# Patient Record
Sex: Female | Born: 1980 | Race: Black or African American | Hispanic: No | Smoking: Current every day smoker
Health system: Southern US, Community
[De-identification: ages and names within clinical notes are randomized; demographics above are authoritative.]

## PROBLEM LIST (undated history)

## (undated) DIAGNOSIS — J45909 Unspecified asthma, uncomplicated: Secondary | ICD-10-CM

## (undated) HISTORY — PX: TUBAL LIGATION: SHX77

## (undated) HISTORY — PX: CHOLECYSTECTOMY: SHX55

---

## 2014-03-05 ENCOUNTER — Encounter (HOSPITAL_COMMUNITY): Payer: Self-pay | Admitting: Emergency Medicine

## 2014-03-05 ENCOUNTER — Emergency Department (HOSPITAL_COMMUNITY)
Admission: EM | Admit: 2014-03-05 | Discharge: 2014-03-06 | Disposition: A | Discharge status: Home / Self Care | Payer: Medicaid Other | Attending: Emergency Medicine | Admitting: Emergency Medicine

## 2014-03-05 DIAGNOSIS — R42 Dizziness and giddiness: Secondary | ICD-10-CM | POA: Insufficient documentation

## 2014-03-05 DIAGNOSIS — F17200 Nicotine dependence, unspecified, uncomplicated: Secondary | ICD-10-CM | POA: Insufficient documentation

## 2014-03-05 DIAGNOSIS — R0789 Other chest pain: Secondary | ICD-10-CM

## 2014-03-05 DIAGNOSIS — J45901 Unspecified asthma with (acute) exacerbation: Secondary | ICD-10-CM | POA: Insufficient documentation

## 2014-03-05 DIAGNOSIS — R071 Chest pain on breathing: Secondary | ICD-10-CM | POA: Insufficient documentation

## 2014-03-05 HISTORY — DX: Unspecified asthma, uncomplicated: J45.909

## 2014-03-05 MED ORDER — OXYCODONE-ACETAMINOPHEN 5-325 MG PO TABS
2.0000 | ORAL_TABLET | Freq: Once | ORAL | Status: AC
  Administered 2014-03-06: 2 via ORAL
  Filled 2014-03-05: qty 2

## 2014-03-05 MED ORDER — ONDANSETRON 8 MG PO TBDP
8.0000 mg | ORAL_TABLET | Freq: Once | ORAL | Status: AC
  Administered 2014-03-06: 8 mg via ORAL
  Filled 2014-03-05: qty 1

## 2014-03-05 MED ORDER — NAPROXEN 500 MG PO TABS
500.0000 mg | ORAL_TABLET | Freq: Once | ORAL | Status: AC
  Administered 2014-03-06: 500 mg via ORAL
  Filled 2014-03-05: qty 1

## 2014-03-05 NOTE — ED Notes (Signed)
Pt states chest pain started earlier today and sharp stabbing,  With sob,

## 2014-03-06 ENCOUNTER — Emergency Department (HOSPITAL_COMMUNITY): Payer: Medicaid Other

## 2014-03-06 LAB — CBC WITH DIFFERENTIAL/PLATELET
BASOS PCT: 0 % (ref 0–1)
Basophils Absolute: 0 10*3/uL (ref 0.0–0.1)
Eosinophils Absolute: 0.2 10*3/uL (ref 0.0–0.7)
Eosinophils Relative: 3 % (ref 0–5)
HEMATOCRIT: 31.8 % — AB (ref 36.0–46.0)
Hemoglobin: 10.5 g/dL — ABNORMAL LOW (ref 12.0–15.0)
Lymphocytes Relative: 47 % — ABNORMAL HIGH (ref 12–46)
Lymphs Abs: 3.7 10*3/uL (ref 0.7–4.0)
MCH: 26.8 pg (ref 26.0–34.0)
MCHC: 33 g/dL (ref 30.0–36.0)
MCV: 81.1 fL (ref 78.0–100.0)
MONO ABS: 0.4 10*3/uL (ref 0.1–1.0)
MONOS PCT: 5 % (ref 3–12)
Neutro Abs: 3.6 10*3/uL (ref 1.7–7.7)
Neutrophils Relative %: 45 % (ref 43–77)
Platelets: 239 10*3/uL (ref 150–400)
RBC: 3.92 MIL/uL (ref 3.87–5.11)
RDW: 14.9 % (ref 11.5–15.5)
WBC: 7.9 10*3/uL (ref 4.0–10.5)

## 2014-03-06 LAB — BASIC METABOLIC PANEL
BUN: 8 mg/dL (ref 6–23)
CO2: 23 meq/L (ref 19–32)
CREATININE: 0.68 mg/dL (ref 0.50–1.10)
Calcium: 9.2 mg/dL (ref 8.4–10.5)
Chloride: 104 mEq/L (ref 96–112)
GFR calc non Af Amer: >90 mL/min (ref >90)
Glucose, Bld: 99 mg/dL (ref 70–99)
Potassium: 3.3 mEq/L — ABNORMAL LOW (ref 3.7–5.3)
Sodium: 140 mEq/L (ref 137–147)

## 2014-03-06 LAB — TROPONIN I: Troponin I: <0.3 ng/mL (ref <0.30)

## 2014-03-06 MED ORDER — ONDANSETRON 8 MG PO TBDP: 8.0000 mg | ORAL_TABLET | Freq: Three times a day (TID) | ORAL | Status: DC | PRN

## 2014-03-06 MED ORDER — NAPROXEN 500 MG PO TABS: 500.0000 mg | ORAL_TABLET | Freq: Two times a day (BID) | ORAL | Status: DC

## 2014-03-06 MED ORDER — OXYCODONE-ACETAMINOPHEN 5-325 MG PO TABS: 1.0000 | ORAL_TABLET | Freq: Four times a day (QID) | ORAL | Status: DC | PRN

## 2014-03-06 NOTE — Discharge Instructions (Signed)
Costochondritis Costochondritis, sometimes called Tietze syndrome, is a swelling and irritation (inflammation) of the tissue (cartilage) that connects your ribs with your breastbone (sternum). It causes pain in the chest and rib area. Costochondritis usually goes away on its own over time. It can take up to 6 weeks or longer to get better, especially if you are unable to limit your activities. CAUSES  Some cases of costochondritis have no known cause. Possible causes include:  Injury (trauma).  Exercise or activity such as lifting.  Severe coughing. SIGNS AND SYMPTOMS  Pain and tenderness in the chest and rib area.  Pain that gets worse when coughing or taking deep breaths.  Pain that gets worse with specific movements. DIAGNOSIS  Your health care provider will do a physical exam and ask about your symptoms. Chest X-rays or other tests may be done to rule out other problems. TREATMENT  Costochondritis usually goes away on its own over time. Your health care provider may prescribe medicine to help relieve pain. HOME CARE INSTRUCTIONS   Avoid exhausting physical activity. Try not to strain your ribs during normal activity. This would include any activities using chest, abdominal, and side muscles, especially if heavy weights are used.  Apply ice to the affected area for the first 2 days after the pain begins.  Put ice in a plastic bag.  Place a towel between your skin and the bag.  Leave the ice on for 20 minutes, 2 3 times a day.  Only take over-the-counter or prescription medicines as directed by your health care provider. SEEK MEDICAL CARE IF:  You have redness or swelling at the rib joints. These are signs of infection.  Your pain does not go away despite rest or medicine. SEEK IMMEDIATE MEDICAL CARE IF:   Your pain increases or you are very uncomfortable.  You have shortness of breath or difficulty breathing.  You cough up blood.  You have worse chest pains,  sweating, or vomiting.  You have a fever or persistent symptoms for more than 2 3 days.  You have a fever and your symptoms suddenly get worse. MAKE SURE YOU:   Understand these instructions.  Will watch your condition.  Will get help right away if you are not doing well or get worse. Document Released: 06/26/2005 Document Revised: 07/07/2013 Document Reviewed: 04/20/2013 [facility] Patient Information 2014 [place], [place].  Chest Wall Pain Chest wall pain is pain in or around the bones and muscles of your chest. It may take up to 6 weeks to get better. It may take longer if you must stay physically active in your work and activities.  CAUSES  Chest wall pain may happen on its own. However, it may be caused by:  A viral illness like the flu.  Injury.  Coughing.  Exercise.  Arthritis.  Fibromyalgia.  Shingles. HOME CARE INSTRUCTIONS   Avoid overtiring physical activity. Try not to strain or perform activities that cause pain. This includes any activities using your chest or your abdominal and side muscles, especially if heavy weights are used.  Put ice on the sore area.  Put ice in a plastic bag.  Place a towel between your skin and the bag.  Leave the ice on for 15-20 minutes per hour while awake for the first 2 days.  Only take over-the-counter or prescription medicines for pain, discomfort, or fever as directed by your caregiver. SEEK IMMEDIATE MEDICAL CARE IF:   Your pain increases, or you are very uncomfortable.  You have a fever.  Your chest pain becomes worse.  You have new, unexplained symptoms.  You have nausea or vomiting.  You feel sweaty or lightheaded.  You have a cough with phlegm (sputum), or you cough up blood. MAKE SURE YOU:   Understand these instructions.  Will watch your condition.  Will get help right away if you are not doing well or get worse. Document Released: 09/16/2005 Document Revised: 12/09/2011 Document Reviewed:  05/13/2011 [facility] Patient Information 2014 [place].

## 2014-03-06 NOTE — ED Provider Notes (Addendum)
CSN: [id number]     Arrival date & time 03/05/14  2249 History   First MD Initiated Contact with Patient 03/05/14 2330     Chief Complaint  Patient presents with  . Chest Pain  . Shortness of Breath     (Consider location/radiation/quality/duration/timing/severity/associated sxs/prior Treatment) HPI 33 year old female presents to emergency apartment with complaint of central chest pain started today around 3 PM.  Pain has been sharp stabbing.  Pain comes in waves, cramping or spasming in nature.  Patient reports some shortness of breath due to pain with deep inhalation.  She reports she's been dizzy.  She denies any palpitations or racing heart rate, no nausea, no diaphoresis.  No fever chills nausea vomiting or diarrhea.  No cough.  Patient reports similar presentation about 2 months ago, was seen at outside hospital.  They told her she had muscle spasms and was placed on Percocet which she took for 2 weeks.  After this time the pain resolved.  She did not followup with her primary care Dr.  Patient reports that her grandfather recently died of coronary disease complications at the age of 33.  She reports her mother had DVT in her 4740s to 5950s.  Patient denies any prolonged immobilization, no exogenous hormones, no calf pain or swelling.  Patient reports she smokes 5 cigarettes a week.  She denies any cocaine use. Past Medical History  Diagnosis Date  . Asthma    Past Surgical History  Procedure Laterality Date  . Cholecystectomy     History reviewed. No pertinent family history. History  Substance Use Topics  . Smoking status: Not on file  . Smokeless tobacco: Not on file  . Alcohol Use: Yes     Comment: occassionally    Review of Systems   See History of Present Illness; otherwise all other systems are reviewed and negative  Allergies  Dilaudid  Home Medications   Prior to Admission medications   Not on File   BP 123/71  Pulse 92  Temp(Src) 98.4 F (36.9 C) (Oral)  Resp  18  SpO2 97% Physical Exam  Nursing note and vitals reviewed. Constitutional: He is oriented to person, place, and time. He appears well-developed and well-nourished. He appears distressed.  HENT:  Head: Normocephalic and atraumatic.  Right Ear: External ear normal.  Left Ear: External ear normal.  Nose: Nose normal.  Mouth/Throat: Oropharynx is clear and moist.  Eyes: Conjunctivae and EOM are normal. Pupils are equal, round, and reactive to light.  Neck: Normal range of motion. Neck supple. No JVD present. No tracheal deviation present. No thyromegaly present.  Cardiovascular: Normal rate, regular rhythm, normal heart sounds and intact distal pulses.  Exam reveals no gallop and no friction rub.   No murmur heard. Pulmonary/Chest: Effort normal and breath sounds normal. No stridor. No respiratory distress. He has no wheezes. He has no rales. He exhibits tenderness (patient has tenderness to left side of the sternum at costal margin.  Palpation of this region reproduces her pain that brought her to the emergency department today).  Abdominal: Soft. Bowel sounds are normal. He exhibits no distension and no mass. There is no tenderness. There is no rebound and no guarding.  Musculoskeletal: Normal range of motion. He exhibits no edema and no tenderness.  Lymphadenopathy:    He has no cervical adenopathy.  Neurological: He is alert and oriented to person, place, and time. He exhibits normal muscle tone. Coordination normal.  Skin: Skin is warm and dry. No rash  noted. No erythema. No pallor.  Psychiatric: He has a normal mood and affect. His behavior is normal. Judgment and thought content normal.    ED Course  Procedures (including critical care time) Labs Review Labs Reviewed  CBC WITH DIFFERENTIAL - Abnormal; Notable for the following:    Hemoglobin 10.5 (*)    HCT 31.8 (*)    Lymphocytes Relative 47 (*)    All other components within normal limits  BASIC METABOLIC PANEL - Abnormal;  Notable for the following:    Potassium 3.3 (*)    All other components within normal limits  TROPONIN I    Imaging Review Dg Chest 2 View  03/06/2014   CLINICAL DATA:  Chest pain and shortness of Breath.  EXAM: CHEST  2 VIEW  COMPARISON:  None.  FINDINGS: The cardiac silhouette, mediastinal and hilar contours are normal. The lungs are clear. No pleural effusion. The bony thorax is intact.  IMPRESSION: No acute cardiopulmonary findings.   Electronically Signed   By: Loralie Champagne M.D.   On: 03/06/2014 00:16     EKG Interpretation   Date/Time:  Saturday March 05 2014 22:58:00 EDT Ventricular Rate:  91 PR Interval:  155 QRS Duration: 91 QT Interval:  360 QTC Calculation: 443 R Axis:   26 Text Interpretation:  Sinus rhythm Borderline T wave abnormalities No old  tracing to compare Confirmed by Adylee Leonardo  MD, Dayna Alia ([id number]) on 03/05/2014  11:35:25 PM      MDM   Final diagnoses:  Costochondral chest pain    33 year old female with central chest pain that is reproducible on exam.  Patient's pertinent negative.  She is low risk for coronary disease.  Her EKG shows some nonspecific T wave abnormalities.  Pain has been persistent since 3 PM.  Will treat for pain, check baseline labs to rule out other significant causes of pain, but I feel this is costochondritis.  1:16 AM Pt feeling better.  Workup here unremarkable.  To f/u with pcm  Olivia Mackie, MD 03/06/14 6213  Olivia Mackie, MD 03/06/14 [phone]

## 2014-03-15 ENCOUNTER — Encounter (HOSPITAL_BASED_OUTPATIENT_CLINIC_OR_DEPARTMENT_OTHER): Payer: Self-pay | Admitting: Emergency Medicine

## 2014-03-15 ENCOUNTER — Emergency Department (HOSPITAL_BASED_OUTPATIENT_CLINIC_OR_DEPARTMENT_OTHER)
Admission: EM | Admit: 2014-03-15 | Discharge: 2014-03-15 | Disposition: A | Discharge status: Home / Self Care | Payer: Medicaid Other | Attending: Emergency Medicine | Admitting: Emergency Medicine

## 2014-03-15 ENCOUNTER — Emergency Department (HOSPITAL_BASED_OUTPATIENT_CLINIC_OR_DEPARTMENT_OTHER): Payer: Medicaid Other

## 2014-03-15 DIAGNOSIS — E669 Obesity, unspecified: Secondary | ICD-10-CM | POA: Insufficient documentation

## 2014-03-15 DIAGNOSIS — R609 Edema, unspecified: Secondary | ICD-10-CM | POA: Insufficient documentation

## 2014-03-15 DIAGNOSIS — R079 Chest pain, unspecified: Secondary | ICD-10-CM

## 2014-03-15 DIAGNOSIS — R072 Precordial pain: Secondary | ICD-10-CM | POA: Insufficient documentation

## 2014-03-15 DIAGNOSIS — Z791 Long term (current) use of non-steroidal anti-inflammatories (NSAID): Secondary | ICD-10-CM | POA: Insufficient documentation

## 2014-03-15 DIAGNOSIS — R42 Dizziness and giddiness: Secondary | ICD-10-CM | POA: Insufficient documentation

## 2014-03-15 DIAGNOSIS — F17200 Nicotine dependence, unspecified, uncomplicated: Secondary | ICD-10-CM | POA: Insufficient documentation

## 2014-03-15 DIAGNOSIS — J45901 Unspecified asthma with (acute) exacerbation: Secondary | ICD-10-CM | POA: Insufficient documentation

## 2014-03-15 LAB — BASIC METABOLIC PANEL
BUN: 8 mg/dL (ref 6–23)
CHLORIDE: 102 meq/L (ref 96–112)
CO2: 25 meq/L (ref 19–32)
Calcium: 9.8 mg/dL (ref 8.4–10.5)
Creatinine, Ser: 0.8 mg/dL (ref 0.50–1.10)
GFR calc non Af Amer: >90 mL/min (ref >90)
Glucose, Bld: 98 mg/dL (ref 70–99)
POTASSIUM: 3.9 meq/L (ref 3.7–5.3)
Sodium: 141 mEq/L (ref 137–147)

## 2014-03-15 LAB — CBC WITH DIFFERENTIAL/PLATELET
BASOS PCT: 0 % (ref 0–1)
Basophils Absolute: 0 10*3/uL (ref 0.0–0.1)
Eosinophils Absolute: 0.2 10*3/uL (ref 0.0–0.7)
Eosinophils Relative: 3 % (ref 0–5)
HEMATOCRIT: 33.9 % — AB (ref 36.0–46.0)
HEMOGLOBIN: 11.1 g/dL — AB (ref 12.0–15.0)
Lymphocytes Relative: 38 % (ref 12–46)
Lymphs Abs: 3.1 10*3/uL (ref 0.7–4.0)
MCH: 27.2 pg (ref 26.0–34.0)
MCHC: 32.7 g/dL (ref 30.0–36.0)
MCV: 83.1 fL (ref 78.0–100.0)
Monocytes Absolute: 0.5 10*3/uL (ref 0.1–1.0)
Monocytes Relative: 6 % (ref 3–12)
NEUTROS ABS: 4.3 10*3/uL (ref 1.7–7.7)
NEUTROS PCT: 53 % (ref 43–77)
Platelets: 263 10*3/uL (ref 150–400)
RBC: 4.08 MIL/uL (ref 3.87–5.11)
RDW: 14.8 % (ref 11.5–15.5)
WBC: 8 10*3/uL (ref 4.0–10.5)

## 2014-03-15 LAB — TROPONIN I: Troponin I: <0.3 ng/mL (ref <0.30)

## 2014-03-15 LAB — D-DIMER, QUANTITATIVE (NOT AT ARMC)

## 2014-03-15 MED ORDER — KETOROLAC TROMETHAMINE 60 MG/2ML IM SOLN
60.0000 mg | Freq: Once | INTRAMUSCULAR | Status: AC
  Administered 2014-03-15: 60 mg via INTRAMUSCULAR
  Filled 2014-03-15: qty 2

## 2014-03-15 MED ORDER — IBUPROFEN 600 MG PO TABS: 600.0000 mg | ORAL_TABLET | Freq: Four times a day (QID) | ORAL | Status: DC | PRN

## 2014-03-15 MED ORDER — HYDROCODONE-ACETAMINOPHEN 5-325 MG PO TABS: 1.0000 | ORAL_TABLET | Freq: Four times a day (QID) | ORAL | Status: DC | PRN

## 2014-03-15 NOTE — ED Provider Notes (Addendum)
CSN: [id number]     Arrival date & time 03/15/14  2110 History  This chart was scribed for Shelby Batonourtney F Horton, MD by Ardelia Memsylan Malpass, ED Scribe. This patient was seen in room MH10/MH10 and the patient's care was started at 9:23 PM.   Chief Complaint  Patient presents with  . Chest Pain    The history is provided by the patient. No language interpreter was used.    HPI Comments: Shelby Leach is a 33 y.o. female who presents to the Emergency Department complaining of intermittent, "sharp, shooting" substernal chest pain over the past 1-2 weeks. She states that her pain is worsened with deep breathing and movement. She rates her pain at 8/10 currently, and states that her pain is a 9 or 10/10 at its worst. She reports associated SOB while she is having chest pain. She also reports associated lightheadedness today. She states that she had associated episodes of emesis when her chest pain began 1-2 weeks ago, but none since. She states that she had mild swelling in her right ankle last week, which has resolved. She states [facility] the same chest pain. She states that she was told her pain was due to inflammation, and she is concerned that there could instead be a cardiac cause. She denies any history of HTN, DM or HLD. She denies any history of DVT/PE. She denies any history of cardiac disease, but notes that her grandfather had an MI in his 6750s. She denies any associated cough or fever. She states that she is not a smoker. She reports a history of tubal ligation, and states there is no chance she could be pregnant.   Past Medical History  Diagnosis Date  . Asthma    Past Surgical History  Procedure Laterality Date  . Cholecystectomy    . Tubal ligation     No family history on file. History  Substance Use Topics  . Smoking status: Current Every Day Smoker  . Smokeless tobacco: Not on file  . Alcohol Use: Yes     Comment: occassionally   OB  History   Grav Para Term Preterm Abortions TAB SAB Ect Mult Living                 Review of Systems  Constitutional: Negative for fever.  Respiratory: Positive for chest tightness and shortness of breath. Negative for cough.   Cardiovascular: Negative for chest pain and leg swelling.  Gastrointestinal: Negative for nausea, vomiting and abdominal pain.  Genitourinary: Negative for dysuria.  Musculoskeletal: Negative for back pain.  Skin: Negative for rash.  Neurological: Negative for headaches.  All other systems reviewed and are negative.    Allergies  Dilaudid  Home Medications   Prior to Admission medications   Medication Sig Start Date End Date Taking? Authorizing Provider  HYDROcodone-acetaminophen (NORCO/VICODIN) 5-325 MG per tablet Take 1 tablet by mouth every 6 (six) hours as needed for moderate pain or severe pain. 03/15/14   Shelby Batonourtney F Horton, MD  ibuprofen (ADVIL,MOTRIN) 600 MG tablet Take 1 tablet (600 mg total) by mouth every 6 (six) hours as needed. 03/15/14   Shelby Batonourtney F Horton, MD  naproxen (NAPROSYN) 500 MG tablet Take 1 tablet (500 mg total) by mouth 2 (two) times daily with a meal. 03/06/14   Olivia Mackielga M Otter, MD  ondansetron (ZOFRAN-ODT) 8 MG disintegrating tablet Take 1 tablet (8 mg total) by mouth every 8 (eight) hours as needed  for nausea or vomiting. 03/06/14   Olivia Mackie, MD  oxyCODONE-acetaminophen (PERCOCET/ROXICET) 5-325 MG per tablet Take 1-2 tablets by mouth every 6 (six) hours as needed for severe pain. 03/06/14   Olivia Mackie, MD   Triage Vitals: BP 135/83  Pulse 90  Temp(Src) 98.4 F (36.9 C) (Oral)  Resp 20  Ht 5\' 2"  (1.575 m)  Wt 267 lb (121.11 kg)  BMI 48.82 kg/m2  SpO2 97%  LMP 03/09/2014  Physical Exam  Nursing note and vitals reviewed. Constitutional: She is oriented to person, place, and time. She appears well-developed and well-nourished.  Obese  HENT:  Head: Normocephalic and atraumatic.  Cardiovascular: Normal rate, regular rhythm and  normal heart sounds.   No murmur heard. Pulmonary/Chest: Effort normal and breath sounds normal. No respiratory distress. She has no wheezes. She exhibits no tenderness.  Abdominal: Soft. Bowel sounds are normal. There is no tenderness. There is no rebound.  Musculoskeletal:  Trace symmetric bilateral lower remedy edema  Neurological: She is alert and oriented to person, place, and time.  Skin: Skin is warm and dry.  Psychiatric: She has a normal mood and affect.    ED Course  Procedures (including critical care time)  DIAGNOSTIC STUDIES: Oxygen Saturation is 97% on RA, normal by my interpretation.    COORDINATION OF CARE: 9:28 PM- Discussed plan to obtain a CXR, along with diagnostic lab work. Will also order Toradol. Pt advised of plan for treatment and pt agrees.  Labs Review Labs Reviewed  CBC WITH DIFFERENTIAL - Abnormal; Notable for the following:    Hemoglobin 11.1 (*)    HCT 33.9 (*)    All other components within normal limits  BASIC METABOLIC PANEL  D-DIMER, QUANTITATIVE  TROPONIN I    Imaging Review Dg Chest 2 View  03/15/2014   CLINICAL DATA:  Left chest pain and shortness of breath. History of asthma and smoking.  EXAM: CHEST  2 VIEW  COMPARISON:  Chest radiograph performed 02/09/2014  FINDINGS: The lungs are well-aerated. Pulmonary vascularity is at the upper limits of normal. There is no evidence of focal opacification, pleural effusion or pneumothorax.  The heart is normal in size; the mediastinal contour is within normal limits. No acute osseous abnormalities are seen. Clips are noted within the right upper quadrant, reflecting prior cholecystectomy.  IMPRESSION: No acute cardiopulmonary process seen.   Electronically Signed   By: Roanna Raider M.D.   On: 03/15/2014 22:10     EKG Interpretation  EKG: Normal sinus rhythm with a rate of 96, nonspecific T wave abnormalities which are unchanged from prior, no evidence of acute ischemia      MDM   Final  diagnoses:  Chest pain    Patient presents with chest pain. Was seen and evaluated one week ago for the same. At that time felt to have costochondritis. She reports persistent symptoms since that time. She is nontoxic on exam.  Heart rate during evaluation was 105. EKG is unchanged from prior. Chest x-ray shows no evidence of pneumonia or pneumothorax. D-dimer is negative. Troponin is reassuring. Discussed workup with patient. At this time unsure of etiology for chest pain but suspect continued inflammation. Have encouraged her to continue anti-inflammatories at home.  Patient to followup with PCP.  After history, exam, and medical workup I feel the patient has been appropriately medically screened and is safe for discharge home. Pertinent diagnoses were discussed with the patient. Patient was given return precautions.  I personally performed the services  described in this documentation, which was scribed in my presence. The recorded information has been reviewed and is accurate.   Shelby Batonourtney F Horton, MD 03/15/14 [id number]  Shelby Batonourtney F Horton, MD 03/15/14 [phone]

## 2014-03-15 NOTE — ED Notes (Signed)
MD at bedside. 

## 2014-03-15 NOTE — Discharge Instructions (Signed)
Chest Pain (Nonspecific) °It is often hard to give a specific diagnosis for the cause of chest pain. There is always a chance that your pain could be related to something serious, such as a heart attack or a blood clot in the lungs. You need to follow up with your caregiver for further evaluation. °CAUSES  °· Heartburn. °· Pneumonia or bronchitis. °· Anxiety or stress. °· Inflammation around your heart (pericarditis) or lung (pleuritis or pleurisy). °· A blood clot in the lung. °· A collapsed lung (pneumothorax). It can develop suddenly on its own (spontaneous pneumothorax) or from injury (trauma) to the chest. °· Shingles infection (herpes zoster virus). °The chest wall is composed of bones, muscles, and cartilage. Any of these can be the source of the pain. °· The bones can be bruised by injury. °· The muscles or cartilage can be strained by coughing or overwork. °· The cartilage can be affected by inflammation and become sore (costochondritis). °DIAGNOSIS  °Lab tests or other studies, such as X-rays, electrocardiography, stress testing, or cardiac imaging, may be needed to find the cause of your pain.  °TREATMENT  °· Treatment depends on what may be causing your chest pain. Treatment may include: °· Acid blockers for heartburn. °· Anti-inflammatory medicine. °· Pain medicine for inflammatory conditions. °· Antibiotics if an infection is present. °· You may be advised to change lifestyle habits. This includes stopping smoking and avoiding alcohol, caffeine, and chocolate. °· You may be advised to keep your head raised (elevated) when sleeping. This reduces the chance of acid going backward from your stomach into your esophagus. °· Most of the time, nonspecific chest pain will improve within 2 to 3 days with rest and mild pain medicine. °HOME CARE INSTRUCTIONS  °· If antibiotics were prescribed, take your antibiotics as directed. Finish them even if you start to feel better. °· For the next few days, avoid physical  activities that bring on chest pain. Continue physical activities as directed. °· Do not smoke. °· Avoid drinking alcohol. °· Only take over-the-counter or prescription medicine for pain, discomfort, or fever as directed by your caregiver. °· Follow your caregiver's suggestions for further testing if your chest pain does not go away. °· Keep any follow-up appointments you made. If you do not go to an appointment, you could develop lasting (chronic) problems with pain. If there is any problem keeping an appointment, you must call to reschedule. °SEEK MEDICAL CARE IF:  °· You think you are having problems from the medicine you are taking. Read your medicine instructions carefully. °· Your chest pain does not go away, even after treatment. °· You develop a rash with blisters on your chest. °SEEK IMMEDIATE MEDICAL CARE IF:  °· You have increased chest pain or pain that spreads to your arm, neck, jaw, back, or abdomen. °· You develop shortness of breath, an increasing cough, or you are coughing up blood. °· You have severe back or abdominal pain, feel nauseous, or vomit. °· You develop severe weakness, fainting, or chills. °· You have a fever. °THIS IS AN EMERGENCY. Do not wait to see if the pain will go away. Get medical help at once. Call your local emergency services (911 in [place] Do not drive yourself to the hospital. °MAKE SURE YOU:  °· Understand these instructions. °· Will watch your condition. °· Will get help right away if you are not doing well or get worse. °Document Released: 06/26/2005 Document Revised: 12/09/2011 Document Reviewed: 04/21/2008 °[employer]® Patient Information ©2014 [employer],   LL[employer]. ° °

## 2014-03-15 NOTE — ED Notes (Addendum)
C/o CP started today 2pm-[facility]

## 2014-09-30 ENCOUNTER — Encounter (HOSPITAL_COMMUNITY): Payer: Self-pay | Admitting: Emergency Medicine

## 2014-09-30 ENCOUNTER — Emergency Department (HOSPITAL_COMMUNITY): Payer: Medicaid Other

## 2014-09-30 ENCOUNTER — Emergency Department (HOSPITAL_COMMUNITY)
Admission: EM | Admit: 2014-09-30 | Discharge: 2014-09-30 | Disposition: A | Discharge status: Home / Self Care | Payer: Medicaid Other | Attending: Emergency Medicine | Admitting: Emergency Medicine

## 2014-09-30 DIAGNOSIS — Z720 Tobacco use: Secondary | ICD-10-CM | POA: Insufficient documentation

## 2014-09-30 DIAGNOSIS — Z880 Allergy status to penicillin: Secondary | ICD-10-CM | POA: Diagnosis not present

## 2014-09-30 DIAGNOSIS — J45909 Unspecified asthma, uncomplicated: Secondary | ICD-10-CM | POA: Diagnosis not present

## 2014-09-30 DIAGNOSIS — Z9851 Tubal ligation status: Secondary | ICD-10-CM | POA: Diagnosis not present

## 2014-09-30 DIAGNOSIS — R110 Nausea: Secondary | ICD-10-CM | POA: Insufficient documentation

## 2014-09-30 DIAGNOSIS — Z791 Long term (current) use of non-steroidal anti-inflammatories (NSAID): Secondary | ICD-10-CM | POA: Diagnosis not present

## 2014-09-30 DIAGNOSIS — R109 Unspecified abdominal pain: Secondary | ICD-10-CM | POA: Diagnosis present

## 2014-09-30 DIAGNOSIS — Z9049 Acquired absence of other specified parts of digestive tract: Secondary | ICD-10-CM | POA: Diagnosis not present

## 2014-09-30 DIAGNOSIS — Z3202 Encounter for pregnancy test, result negative: Secondary | ICD-10-CM | POA: Insufficient documentation

## 2014-09-30 DIAGNOSIS — R102 Pelvic and perineal pain: Secondary | ICD-10-CM | POA: Diagnosis not present

## 2014-09-30 LAB — URINALYSIS, ROUTINE W REFLEX MICROSCOPIC
Bilirubin Urine: NEGATIVE
Glucose, UA: NEGATIVE mg/dL
Hgb urine dipstick: NEGATIVE
KETONES UR: NEGATIVE mg/dL
LEUKOCYTES UA: NEGATIVE
NITRITE: NEGATIVE
PROTEIN: NEGATIVE mg/dL
Specific Gravity, Urine: 1.023 (ref 1.005–1.030)
UROBILINOGEN UA: 0.2 mg/dL (ref 0.0–1.0)
pH: 6.5 (ref 5.0–8.0)

## 2014-09-30 LAB — CBC WITH DIFFERENTIAL/PLATELET
Basophils Absolute: 0 10*3/uL (ref 0.0–0.1)
Basophils Relative: 0 % (ref 0–1)
Eosinophils Absolute: 0.2 10*3/uL (ref 0.0–0.7)
Eosinophils Relative: 4 % (ref 0–5)
HCT: 34.6 % — ABNORMAL LOW (ref 36.0–46.0)
HEMOGLOBIN: 10.8 g/dL — AB (ref 12.0–15.0)
Lymphocytes Relative: 45 % (ref 12–46)
Lymphs Abs: 2.8 10*3/uL (ref 0.7–4.0)
MCH: 25.4 pg — AB (ref 26.0–34.0)
MCHC: 31.2 g/dL (ref 30.0–36.0)
MCV: 81.4 fL (ref 78.0–100.0)
MONO ABS: 0.3 10*3/uL (ref 0.1–1.0)
MONOS PCT: 4 % (ref 3–12)
Neutro Abs: 2.9 10*3/uL (ref 1.7–7.7)
Neutrophils Relative %: 47 % (ref 43–77)
Platelets: 240 10*3/uL (ref 150–400)
RBC: 4.25 MIL/uL (ref 3.87–5.11)
RDW: 14.6 % (ref 11.5–15.5)
WBC: 6.2 10*3/uL (ref 4.0–10.5)

## 2014-09-30 LAB — PREGNANCY, URINE: Preg Test, Ur: NEGATIVE

## 2014-09-30 LAB — COMPREHENSIVE METABOLIC PANEL
ALBUMIN: 4.1 g/dL (ref 3.5–5.2)
ALT: 20 U/L (ref 0–35)
AST: 21 U/L (ref 0–37)
Alkaline Phosphatase: 73 U/L (ref 39–117)
Anion gap: 10 (ref 5–15)
BUN: 8 mg/dL (ref 6–23)
CO2: 23 mmol/L (ref 19–32)
CREATININE: 0.64 mg/dL (ref 0.50–1.10)
Calcium: 9.5 mg/dL (ref 8.4–10.5)
Chloride: 106 mEq/L (ref 96–112)
GFR calc non Af Amer: >90 mL/min (ref >90)
Glucose, Bld: 92 mg/dL (ref 70–99)
Potassium: 3.9 mmol/L (ref 3.5–5.1)
Sodium: 139 mmol/L (ref 135–145)
TOTAL PROTEIN: 8.3 g/dL (ref 6.0–8.3)
Total Bilirubin: 1 mg/dL (ref 0.3–1.2)

## 2014-09-30 LAB — WET PREP, GENITAL
TRICH WET PREP: NONE SEEN
YEAST WET PREP: NONE SEEN

## 2014-09-30 LAB — LIPASE, BLOOD: Lipase: 28 U/L (ref 11–59)

## 2014-09-30 MED ORDER — OXYCODONE-ACETAMINOPHEN 5-325 MG PO TABS
1.0000 | ORAL_TABLET | Freq: Once | ORAL | Status: AC
  Administered 2014-09-30: 1 via ORAL
  Filled 2014-09-30: qty 1

## 2014-09-30 MED ORDER — OXYCODONE-ACETAMINOPHEN 5-325 MG PO TABS: 1.0000 | ORAL_TABLET | ORAL | Status: DC | PRN

## 2014-09-30 MED ORDER — ONDANSETRON HCL 4 MG PO TABS: 4.0000 mg | ORAL_TABLET | Freq: Four times a day (QID) | ORAL | Status: DC

## 2014-09-30 NOTE — ED Notes (Signed)
Pt transported to [facility] via wheelchair.

## 2014-09-30 NOTE — ED Notes (Signed)
Pt c/o low abd pain w/o NVD since last night.  Denies vaginal discharge or trouble urinating.

## 2014-09-30 NOTE — ED Notes (Signed)
Pt returned from [facility]

## 2014-09-30 NOTE — ED Notes (Signed)
Pt has a ride home.  

## 2014-09-30 NOTE — Discharge Instructions (Signed)
Abdominal Pain °Many things can cause abdominal pain. Usually, abdominal pain is not caused by a disease and will improve without treatment. It can often be observed and treated at home. Your health care provider will do a physical exam and possibly order blood tests and X-rays to help determine the seriousness of your pain. However, in many cases, more time must pass before a clear cause of the pain can be found. Before that point, your health care provider may not know if you need more testing or further treatment. °HOME CARE INSTRUCTIONS  °Monitor your abdominal pain for any changes. The following actions may help to alleviate any discomfort you are experiencing: °· Only take over-the-counter or prescription medicines as directed by your health care provider. °· Do not take laxatives unless directed to do so by your health care provider. °· Try a clear liquid diet (broth, tea, or water) as directed by your health care provider. Slowly move to a bland diet as tolerated. °SEEK MEDICAL CARE IF: °· You have unexplained abdominal pain. °· You have abdominal pain associated with nausea or diarrhea. °· You have pain when you urinate or have a bowel movement. °· You experience abdominal pain that wakes you in the night. °· You have abdominal pain that is worsened or improved by eating food. °· You have abdominal pain that is worsened with eating fatty foods. °· You have a fever. °SEEK IMMEDIATE MEDICAL CARE IF:  °· Your pain does not go away within 2 hours. °· You keep throwing up (vomiting). °· Your pain is felt only in portions of the abdomen, such as the right side or the left lower portion of the abdomen. °· You pass bloody or black tarry stools. °MAKE SURE YOU: °· Understand these instructions.   °· Will watch your condition.   °· Will get help right away if you are not doing well or get worse.   °Document Released: 06/26/2005 Document Revised: 09/21/2013 Document Reviewed: 05/26/2013 °ExitCare® Patient Information  ©2015 [employer]. This information is not intended to replace advice given to you by your health care provider. Make sure you discuss any questions you have with your health care provider. ° ° ° °Pelvic Pain °Female pelvic pain can be caused by many different things and start from a variety of places. Pelvic pain refers to pain that is located in the lower half of the abdomen and between your hips. The pain may occur over a short period of time (acute) or may be reoccurring (chronic). The cause of pelvic pain may be related to disorders affecting the female reproductive organs (gynecologic), but it may also be related to the bladder, kidney stones, an intestinal complication, or muscle or skeletal problems. Getting help right away for pelvic pain is important, especially if there has been severe, sharp, or a sudden onset of unusual pain. It is also important to get help right away because some types of pelvic pain can be life threatening.  °CAUSES  °Below are only some of the causes of pelvic pain. The causes of pelvic pain can be in one of several categories.  °· Gynecologic. °¨ Pelvic inflammatory disease. °¨ Sexually transmitted infection. °¨ Ovarian cyst or a twisted ovarian ligament (ovarian torsion). °¨ Uterine lining that grows outside the uterus (endometriosis). °¨ Fibroids, cysts, or tumors. °¨ Ovulation. °· Pregnancy. °¨ Pregnancy that occurs outside the uterus (ectopic pregnancy). °¨ Miscarriage. °¨ Labor. °¨ Abruption of the placenta or ruptured uterus. °· Infection. °¨ Uterine infection (endometritis). °¨ Bladder infection. °¨ Diverticulitis. °¨   Miscarriage related to a uterine infection (septic abortion). °· Bladder. °¨ Inflammation of the bladder (cystitis). °¨ Kidney stone(s). °· Gastrointestinal. °¨ Constipation. °¨ Diverticulitis. °· Neurologic. °¨ Trauma. °¨ Feeling pelvic pain because of mental or emotional causes (psychosomatic). °· Cancers of the bowel or pelvis. °EVALUATION  °Your caregiver  will want to take a careful history of your concerns. This includes recent changes in your health, a careful gynecologic history of your periods (menses), and a sexual history. Obtaining your family history and medical history is also important. Your caregiver may suggest a pelvic exam. A pelvic exam will help identify the location and severity of the pain. It also helps in the evaluation of which organ system may be involved. In order to identify the cause of the pelvic pain and be properly treated, your caregiver may order tests. These tests may include:  °· A pregnancy test. °· Pelvic ultrasonography. °· An X-ray exam of the abdomen. °· A urinalysis or evaluation of vaginal discharge. °· Blood tests. °HOME CARE INSTRUCTIONS  °· Only take over-the-counter or prescription medicines for pain, discomfort, or fever as directed by your caregiver.   °· Rest as directed by your caregiver.   °· Eat a balanced diet.   °· Drink enough fluids to make your urine clear or pale yellow, or as directed.   °· Avoid sexual intercourse if it causes pain.   °· Apply warm or cold compresses to the lower abdomen depending on which one helps the pain.   °· Avoid stressful situations.   °· Keep a journal of your pelvic pain. Write down when it started, where the pain is located, and if there are things that seem to be associated with the pain, such as food or your menstrual cycle. °· Follow up with your caregiver as directed.   °SEEK MEDICAL CARE IF: °· Your medicine does not help your pain. °· You have abnormal vaginal discharge. °SEEK IMMEDIATE MEDICAL CARE IF:  °· You have heavy bleeding from the vagina.   °· Your pelvic pain increases.   °· You feel light-headed or faint.   °· You have chills.   °· You have pain with urination or blood in your urine.   °· You have uncontrolled diarrhea or vomiting.   °· You have a fever or persistent symptoms for more than 3 days. °· You have a fever and your symptoms suddenly get worse.   °· You are  being physically or sexually abused.   °MAKE SURE YOU: °· Understand these instructions. °· Will watch your condition. °· Will get help if you are not doing well or get worse. °Document Released: 08/13/2004 Document Revised: 01/31/2014 Document Reviewed: 01/06/2012 °Exit[employer]® Patient Information ©2015 [employer]. This information is not intended to replace advice given to you by your health care provider. Make sure you discuss any questions you have with your health care provider. ° °

## 2014-09-30 NOTE — ED Provider Notes (Signed)
CSN: [id number]     Arrival date & time 09/30/14  1610 History  This chart was scribed for Elpidio Anis, PA-C with Suzi Roots, MD by Tonye Royalty, ED Scribe. This patient was seen in room WTR9/WTR9 and the patient's care was started at 9:15 PM.    Chief Complaint  Patient presents with  . Abdominal Pain  . Cough   The history is provided by the patient. No language interpreter was used.    HPI Comments: Shelby Leach is a 34 y.o. female who presents to the Emergency Department complaining of sharp, stabbing, bilateral lower abdominal pain with onset last night. She reports associated decreased appetite and nausea. She states her last bowel movement was yesterday and was normal; she states it is normal for her to not have bowel movements every day since prior cholecystectomy. She states this feels somewhat like prior pain from ovarian cyst. She denies pregnancy due to tubal ligation. She denies dysuria, vaginal discharge, constipation, diarrhea, vomiting, or fever.  Past Medical History  Diagnosis Date  . Asthma    Past Surgical History  Procedure Laterality Date  . Cholecystectomy    . Tubal ligation     No family history on file. History  Substance Use Topics  . Smoking status: Current Every Day Smoker  . Smokeless tobacco: Not on file  . Alcohol Use: Yes     Comment: occassionally   OB History    No data available     Review of Systems  Constitutional: Negative for fever.  Gastrointestinal: Positive for nausea and abdominal pain. Negative for vomiting, diarrhea and constipation.  Genitourinary: Negative for dysuria and vaginal discharge.  All other systems reviewed and are negative.     Allergies  Dilaudid and Penicillins  Home Medications   Prior to Admission medications   Medication Sig Start Date End Date Taking? Authorizing Provider  HYDROcodone-acetaminophen (NORCO/VICODIN) 5-325 MG per tablet Take 1 tablet by mouth every 6 (six) hours as needed for  moderate pain or severe pain. Patient not taking: Reported on 09/30/2014 03/15/14   Shon Baton, MD  ibuprofen (ADVIL,MOTRIN) 600 MG tablet Take 1 tablet (600 mg total) by mouth every 6 (six) hours as needed. Patient not taking: Reported on 09/30/2014 03/15/14   Shon Baton, MD  naproxen (NAPROSYN) 500 MG tablet Take 1 tablet (500 mg total) by mouth 2 (two) times daily with a meal. Patient not taking: Reported on 09/30/2014 03/06/14   Olivia Mackie, MD  ondansetron (ZOFRAN-ODT) 8 MG disintegrating tablet Take 1 tablet (8 mg total) by mouth every 8 (eight) hours as needed for nausea or vomiting. Patient not taking: Reported on 09/30/2014 03/06/14   Olivia Mackie, MD  oxyCODONE-acetaminophen (PERCOCET/ROXICET) 5-325 MG per tablet Take 1-2 tablets by mouth every 6 (six) hours as needed for severe pain. Patient not taking: Reported on 09/30/2014 03/06/14   Olivia Mackie, MD   BP 116/81 mmHg  Pulse 83  Temp(Src) 98.2 F (36.8 C) (Oral)  Resp 16  SpO2 100%  LMP 09/05/2014 Physical Exam  Constitutional: She is oriented to person, place, and time. She appears well-developed and well-nourished.  HENT:  Head: Normocephalic.  Neck: Normal range of motion. Neck supple.  Cardiovascular: Normal rate and regular rhythm.   Pulmonary/Chest: Effort normal and breath sounds normal.  Abdominal: Soft. Bowel sounds are normal. There is tenderness. There is no rebound and no guarding.  Lower abdominal tenderness.  Genitourinary:  Cervix unremarkable. No discharge or cervical erythema.  Tender cervix and right adnexa without mass.   Musculoskeletal: Normal range of motion.  Neurological: She is alert and oriented to person, place, and time.  Skin: Skin is warm and dry. No rash noted.  Psychiatric: She has a normal mood and affect.    ED Course  Procedures (including critical care time)  DIAGNOSTIC STUDIES: Oxygen Saturation is 100% on room air, normal by my interpretation.    COORDINATION OF CARE: 9:18 PM  Discussed treatment plan with patient at beside, including pelvic exam. The patient agrees with the plan and has no further questions at this time.  Labs Review Labs Reviewed  WET PREP, GENITAL - Abnormal; Notable for the following:    Clue Cells Wet Prep HPF POC RARE (*)    WBC, Wet Prep HPF POC RARE (*)    All other components within normal limits  CBC WITH DIFFERENTIAL - Abnormal; Notable for the following:    Hemoglobin 10.8 (*)    HCT 34.6 (*)    MCH 25.4 (*)    All other components within normal limits  GC/CHLAMYDIA PROBE AMP  COMPREHENSIVE METABOLIC PANEL  LIPASE, BLOOD  URINALYSIS, ROUTINE W REFLEX MICROSCOPIC  PREGNANCY, URINE  PREGNANCY, URINE    Imaging Review US Transvaginal Non-ob  09/30/2014   CLINICAL DATA:  Pelvic pain.  EXAM: TRANSABDOMINAL AND TRANSVAGINAL ULTRASOUND OF PELVIS  DOPPLER ULTRASOUND OF OVARIES  TECHNIQUE: Both transabdominal and transvaginal ultrasound examinations of the pelvis were performed. Transabdominal technique was performed for global imaging of the pelvis including uterus, ovaries, adnexal regions, and pelvic cul-de-sac.  It was necessary to proceed with endovaginal exam following the transabdominal exam to visualize the ovaries and endometrium. Color and duplex Doppler ultrasound was utilized to evaluate blood flow to the ovaries.  COMPARISON:  CT urogram 07/29/2014  FINDINGS: Uterus  Measurements: 9.3 x 4.7 x 6.1 cm, anteverted. No fibroids or other mass visualized. Small nabothian cysts in the cervix.  Endometrium  Thickness: 8.3 mm.  No focal abnormality visualized.  Right ovary  Measurements: 3.8 x 2 x 2.7 cm. Normal appearance/no adnexal mass.  Left ovary  Measurements: 3.5 x 2.9 x 2.3 cm. Normal appearance/no adnexal mass.  Pulsed Doppler evaluation of both ovaries demonstrates normal low-resistance arterial and venous waveforms. Flow is demonstrated within both ovaries on color flow Doppler imaging.  Other findings  No free fluid.  IMPRESSION:  Normal ultrasound appearance of the uterus and ovaries.   Electronically Signed   By: Burman Nieves M.D.   On: 09/30/2014 23:02   US Pelvis Complete  09/30/2014   CLINICAL DATA:  Pelvic pain.  EXAM: TRANSABDOMINAL AND TRANSVAGINAL ULTRASOUND OF PELVIS  DOPPLER ULTRASOUND OF OVARIES  TECHNIQUE: Both transabdominal and transvaginal ultrasound examinations of the pelvis were performed. Transabdominal technique was performed for global imaging of the pelvis including uterus, ovaries, adnexal regions, and pelvic cul-de-sac.  It was necessary to proceed with endovaginal exam following the transabdominal exam to visualize the ovaries and endometrium. Color and duplex Doppler ultrasound was utilized to evaluate blood flow to the ovaries.  COMPARISON:  CT urogram 07/29/2014  FINDINGS: Uterus  Measurements: 9.3 x 4.7 x 6.1 cm, anteverted. No fibroids or other mass visualized. Small nabothian cysts in the cervix.  Endometrium  Thickness: 8.3 mm.  No focal abnormality visualized.  Right ovary  Measurements: 3.8 x 2 x 2.7 cm. Normal appearance/no adnexal mass.  Left ovary  Measurements: 3.5 x 2.9 x 2.3 cm. Normal appearance/no adnexal mass.  Pulsed Doppler evaluation of both  ovaries demonstrates normal low-resistance arterial and venous waveforms. Flow is demonstrated within both ovaries on color flow Doppler imaging.  Other findings  No free fluid.  IMPRESSION: Normal ultrasound appearance of the uterus and ovaries.   Electronically Signed   By: Burman Nieves M.D.   On: 09/30/2014 23:02   [place] Art/ven Flow Abd Pelv Doppler  09/30/2014   CLINICAL DATA:  Pelvic pain.  EXAM: TRANSABDOMINAL AND TRANSVAGINAL ULTRASOUND OF PELVIS  DOPPLER ULTRASOUND OF OVARIES  TECHNIQUE: Both transabdominal and transvaginal ultrasound examinations of the pelvis were performed. Transabdominal technique was performed for global imaging of the pelvis including uterus, ovaries, adnexal regions, and pelvic cul-de-sac.  It was necessary to proceed  with endovaginal exam following the transabdominal exam to visualize the ovaries and endometrium. Color and duplex Doppler ultrasound was utilized to evaluate blood flow to the ovaries.  COMPARISON:  CT urogram 07/29/2014  FINDINGS: Uterus  Measurements: 9.3 x 4.7 x 6.1 cm, anteverted. No fibroids or other mass visualized. Small nabothian cysts in the cervix.  Endometrium  Thickness: 8.3 mm.  No focal abnormality visualized.  Right ovary  Measurements: 3.8 x 2 x 2.7 cm. Normal appearance/no adnexal mass.  Left ovary  Measurements: 3.5 x 2.9 x 2.3 cm. Normal appearance/no adnexal mass.  Pulsed Doppler evaluation of both ovaries demonstrates normal low-resistance arterial and venous waveforms. Flow is demonstrated within both ovaries on color flow Doppler imaging.  Other findings  No free fluid.  IMPRESSION: Normal ultrasound appearance of the uterus and ovaries.   Electronically Signed   By: Burman Nieves M.D.   On: 09/30/2014 23:02     EKG Interpretation None      MDM   Final diagnoses:  Pelvic pain in female    Normal lab studies, VSS, afebrile, negative pelvic ultrasound. Pain is improved with medications. Feel she can be discharged home with close PCP follow up. Return precautions discussed.   I personally performed the services described in this documentation, which was scribed in my presence. The recorded information has been reviewed and is accurate.     Arnoldo Hooker, PA-C 09/30/14 2332  Suzi Roots, MD 10/01/14 [phone]

## 2014-10-05 LAB — GC/CHLAMYDIA PROBE AMP
CT PROBE, AMP APTIMA: NEGATIVE
GC Probe RNA: NEGATIVE

## 2014-12-11 ENCOUNTER — Encounter (HOSPITAL_BASED_OUTPATIENT_CLINIC_OR_DEPARTMENT_OTHER): Payer: Self-pay | Admitting: *Deleted

## 2014-12-11 ENCOUNTER — Emergency Department (HOSPITAL_BASED_OUTPATIENT_CLINIC_OR_DEPARTMENT_OTHER): Payer: Medicaid Other

## 2014-12-11 ENCOUNTER — Emergency Department (HOSPITAL_BASED_OUTPATIENT_CLINIC_OR_DEPARTMENT_OTHER)
Admission: EM | Admit: 2014-12-11 | Discharge: 2014-12-11 | Disposition: A | Discharge status: Home / Self Care | Payer: Medicaid Other | Attending: Emergency Medicine | Admitting: Emergency Medicine

## 2014-12-11 DIAGNOSIS — Z79899 Other long term (current) drug therapy: Secondary | ICD-10-CM | POA: Diagnosis not present

## 2014-12-11 DIAGNOSIS — Z720 Tobacco use: Secondary | ICD-10-CM | POA: Diagnosis not present

## 2014-12-11 DIAGNOSIS — M549 Dorsalgia, unspecified: Secondary | ICD-10-CM | POA: Diagnosis not present

## 2014-12-11 DIAGNOSIS — Z880 Allergy status to penicillin: Secondary | ICD-10-CM | POA: Insufficient documentation

## 2014-12-11 DIAGNOSIS — Z9851 Tubal ligation status: Secondary | ICD-10-CM | POA: Insufficient documentation

## 2014-12-11 DIAGNOSIS — R1031 Right lower quadrant pain: Secondary | ICD-10-CM | POA: Diagnosis not present

## 2014-12-11 DIAGNOSIS — R109 Unspecified abdominal pain: Secondary | ICD-10-CM | POA: Diagnosis present

## 2014-12-11 DIAGNOSIS — Z3202 Encounter for pregnancy test, result negative: Secondary | ICD-10-CM | POA: Insufficient documentation

## 2014-12-11 DIAGNOSIS — Z9049 Acquired absence of other specified parts of digestive tract: Secondary | ICD-10-CM | POA: Diagnosis not present

## 2014-12-11 DIAGNOSIS — R110 Nausea: Secondary | ICD-10-CM | POA: Insufficient documentation

## 2014-12-11 DIAGNOSIS — J45909 Unspecified asthma, uncomplicated: Secondary | ICD-10-CM | POA: Diagnosis not present

## 2014-12-11 LAB — URINALYSIS, ROUTINE W REFLEX MICROSCOPIC
Bilirubin Urine: NEGATIVE
Glucose, UA: NEGATIVE mg/dL
KETONES UR: NEGATIVE mg/dL
Leukocytes, UA: NEGATIVE
Nitrite: NEGATIVE
PROTEIN: NEGATIVE mg/dL
SPECIFIC GRAVITY, URINE: 1.024 (ref 1.005–1.030)
Urobilinogen, UA: 1 mg/dL (ref 0.0–1.0)
pH: 6.5 (ref 5.0–8.0)

## 2014-12-11 LAB — BASIC METABOLIC PANEL
Anion gap: 8 (ref 5–15)
BUN: 12 mg/dL (ref 6–23)
CALCIUM: 9.4 mg/dL (ref 8.4–10.5)
CO2: 24 mmol/L (ref 19–32)
Chloride: 107 mmol/L (ref 96–112)
Creatinine, Ser: 0.73 mg/dL (ref 0.50–1.10)
GFR calc Af Amer: >90 mL/min (ref >90)
GFR calc non Af Amer: >90 mL/min (ref >90)
Glucose, Bld: 104 mg/dL — ABNORMAL HIGH (ref 70–99)
Potassium: 3.5 mmol/L (ref 3.5–5.1)
Sodium: 139 mmol/L (ref 135–145)

## 2014-12-11 LAB — CBC WITH DIFFERENTIAL/PLATELET
BASOS ABS: 0 10*3/uL (ref 0.0–0.1)
Basophils Relative: 0 % (ref 0–1)
EOS PCT: 4 % (ref 0–5)
Eosinophils Absolute: 0.3 10*3/uL (ref 0.0–0.7)
HCT: 32.9 % — ABNORMAL LOW (ref 36.0–46.0)
Hemoglobin: 10.5 g/dL — ABNORMAL LOW (ref 12.0–15.0)
Lymphocytes Relative: 39 % (ref 12–46)
Lymphs Abs: 2.8 10*3/uL (ref 0.7–4.0)
MCH: 25.8 pg — ABNORMAL LOW (ref 26.0–34.0)
MCHC: 31.9 g/dL (ref 30.0–36.0)
MCV: 80.8 fL (ref 78.0–100.0)
MONOS PCT: 6 % (ref 3–12)
Monocytes Absolute: 0.5 10*3/uL (ref 0.1–1.0)
NEUTROS PCT: 51 % (ref 43–77)
Neutro Abs: 3.6 10*3/uL (ref 1.7–7.7)
PLATELETS: 247 10*3/uL (ref 150–400)
RBC: 4.07 MIL/uL (ref 3.87–5.11)
RDW: 16.1 % — ABNORMAL HIGH (ref 11.5–15.5)
WBC: 7.2 10*3/uL (ref 4.0–10.5)

## 2014-12-11 LAB — PREGNANCY, URINE: PREG TEST UR: NEGATIVE

## 2014-12-11 LAB — URINE MICROSCOPIC-ADD ON

## 2014-12-11 MED ORDER — IOHEXOL 300 MG/ML  SOLN
50.0000 mL | Freq: Once | INTRAMUSCULAR | Status: AC | PRN
  Administered 2014-12-11: 50 mL via ORAL

## 2014-12-11 MED ORDER — KETOROLAC TROMETHAMINE 60 MG/2ML IM SOLN
60.0000 mg | Freq: Once | INTRAMUSCULAR | Status: AC
  Administered 2014-12-11: 60 mg via INTRAMUSCULAR
  Filled 2014-12-11: qty 2

## 2014-12-11 MED ORDER — IOHEXOL 300 MG/ML  SOLN
100.0000 mL | Freq: Once | INTRAMUSCULAR | Status: AC | PRN
  Administered 2014-12-11: 100 mL via INTRAVENOUS

## 2014-12-11 MED ORDER — HYDROMORPHONE HCL 1 MG/ML IJ SOLN: 0.5000 mg | Freq: Once | INTRAMUSCULAR | Status: DC

## 2014-12-11 MED ORDER — NAPROXEN 500 MG PO TABS: 500.0000 mg | ORAL_TABLET | Freq: Two times a day (BID) | ORAL | Status: DC

## 2014-12-11 MED ORDER — MORPHINE SULFATE 4 MG/ML IJ SOLN
4.0000 mg | Freq: Once | INTRAMUSCULAR | Status: AC
  Administered 2014-12-11: 4 mg via INTRAVENOUS
  Filled 2014-12-11: qty 1

## 2014-12-11 NOTE — ED Notes (Signed)
Right side abd pain x 1 week- +nausea- also c/o pain in her back

## 2014-12-11 NOTE — ED Provider Notes (Signed)
CSN: [id number]     Arrival date & time 12/11/14  1731 History   None    Chief Complaint  Patient presents with  . Abdominal Pain     (Consider location/radiation/quality/duration/timing/severity/associated sxs/prior Treatment) Patient is a 34 y.o. female presenting with abdominal pain. The history is provided by the patient. No language interpreter was used.  Abdominal Pain Associated symptoms: nausea   Associated symptoms: no chills, no dysuria and no fever   Shelby Leach is a 34 y.o black female who presents with new onset right sided abdominal pain that has gradually worsened over the last 7 days. She states her pain is 9/10 now. She has taken Aleve for the pain without relief.  Moving makes it worse.  Nothing seems to make it better.  She is currently on her menstrual cycle.  She denies any fever, urinary frequency or urgency, no nausea, no vomiting.  She denies history of kidney stones or kidney infections in the past.  She is monogamous with her husband.  She had a normal yearly gyn exam 2 weeks ago and said that she did not have a history of STD's.  She is near the end of her menstrual cycle.   Past Medical History  Diagnosis Date  . Asthma    Past Surgical History  Procedure Laterality Date  . Cholecystectomy    . Tubal ligation     No family history on file. History  Substance Use Topics  . Smoking status: Current Some Day Smoker    Types: Cigarettes  . Smokeless tobacco: Not on file  . Alcohol Use: Yes     Comment: occassionally   OB History    No data available     Review of Systems  Constitutional: Negative for fever and chills.  Gastrointestinal: Positive for nausea and abdominal pain.  Genitourinary: Negative for dysuria, urgency, frequency and difficulty urinating.  Musculoskeletal: Positive for back pain.  All other systems reviewed and are negative.     Allergies  Dilaudid and Penicillins  Home Medications   Prior to Admission medications    Medication Sig Start Date End Date Taking? Authorizing Provider  HYDROcodone-acetaminophen (NORCO/VICODIN) 5-325 MG per tablet Take 1 tablet by mouth every 6 (six) hours as needed for moderate pain or severe pain. Patient not taking: Reported on 09/30/2014 03/15/14   Shon Baton, MD  ibuprofen (ADVIL,MOTRIN) 600 MG tablet Take 1 tablet (600 mg total) by mouth every 6 (six) hours as needed. Patient not taking: Reported on 09/30/2014 03/15/14   Shon Baton, MD  naproxen (NAPROSYN) 500 MG tablet Take 1 tablet (500 mg total) by mouth 2 (two) times daily. 12/11/14   Shelby Binns Patel-Mills, PA-C  ondansetron (ZOFRAN) 4 MG tablet Take 1 tablet (4 mg total) by mouth every 6 (six) hours. 09/30/14   Elpidio Anis, PA-C  ondansetron (ZOFRAN-ODT) 8 MG disintegrating tablet Take 1 tablet (8 mg total) by mouth every 8 (eight) hours as needed for nausea or vomiting. Patient not taking: Reported on 09/30/2014 03/06/14   Marisa Severin, MD  oxyCODONE-acetaminophen (PERCOCET/ROXICET) 5-325 MG per tablet Take 1-2 tablets by mouth every 6 (six) hours as needed for severe pain. Patient not taking: Reported on 09/30/2014 03/06/14   Marisa Severin, MD  oxyCODONE-acetaminophen (PERCOCET/ROXICET) 5-325 MG per tablet Take 1-2 tablets by mouth every 4 (four) hours as needed for severe pain. 09/30/14   Shelby Upstill, PA-C   BP 116/68 mmHg  Pulse 59  Temp(Src) 99.5 F (37.5 C) (Oral)  Resp 14  Ht 5\' 2"  (1.575 m)  Wt 256 lb (116.121 kg)  BMI 46.81 kg/m2  SpO2 98%  LMP 12/04/2014 Physical Exam  Constitutional: She is oriented to person, place, and time. She appears well-developed and well-nourished. She is cooperative.  Appears uncomfortable and in pain.  Morbidly obese.  HENT:  Head: Normocephalic and atraumatic.  Eyes: Conjunctivae are normal.  Neck: Normal range of motion. Neck supple.  Cardiovascular: Normal rate, regular rhythm and normal heart sounds.   Pulmonary/Chest: Effort normal and breath sounds normal.  Abdominal: Soft.   Tenderness to palpation of right lower quadrant and right CVA.  No guarding. No rebound.   Musculoskeletal: Normal range of motion.  Neurological: She is alert and oriented to person, place, and time.  Skin: Skin is warm and dry.  Nursing note and vitals reviewed.   ED Course  Procedures (including critical care time) Labs Review Labs Reviewed  URINALYSIS, ROUTINE W REFLEX MICROSCOPIC - Abnormal; Notable for the following:    Hgb urine dipstick MODERATE (*)    All other components within normal limits  CBC WITH DIFFERENTIAL/PLATELET - Abnormal; Notable for the following:    Hemoglobin 10.5 (*)    HCT 32.9 (*)    MCH 25.8 (*)    RDW 16.1 (*)    All other components within normal limits  BASIC METABOLIC PANEL - Abnormal; Notable for the following:    Glucose, Bld 104 (*)    All other components within normal limits  URINE MICROSCOPIC-ADD ON - Abnormal; Notable for the following:    Squamous Epithelial / LPF FEW (*)    Bacteria, UA FEW (*)    All other components within normal limits  PREGNANCY, URINE    Imaging Review Ct Abdomen Pelvis W Contrast  12/11/2014   CLINICAL DATA:  Acute onset of right-sided abdominal pain for 1 week. Nausea. Back pain. Initial encounter.  EXAM: CT ABDOMEN AND PELVIS WITH CONTRAST  TECHNIQUE: Multidetector CT imaging of the abdomen and pelvis was performed using the standard protocol following bolus administration of intravenous contrast.  CONTRAST:  50mL OMNIPAQUE IOHEXOL 300 MG/ML SOLN, 100mL OMNIPAQUE IOHEXOL 300 MG/ML SOLN  COMPARISON:  CT of the abdomen and pelvis from 07/29/2014, and pelvic ultrasound performed 09/30/2014  FINDINGS: The visualized lung bases are clear.  The liver and spleen are unremarkable in appearance. The patient is status post cholecystectomy, with clips noted along the gallbladder fossa. The pancreas and adrenal glands are unremarkable.  The kidneys are unremarkable in appearance. There is no evidence of hydronephrosis. No renal  or ureteral stones are seen. No perinephric stranding is appreciated.  No free fluid is identified. The small bowel is unremarkable in appearance. The stomach is within normal limits. No acute vascular abnormalities are seen.  The appendix is normal in caliber, without evidence for appendicitis. The colon is unremarkable in appearance.  The bladder is decompressed and not well assessed. The uterus is grossly unremarkable in appearance. No suspicious adnexal masses are seen. The left ovary is unremarkable in appearance. No inguinal lymphadenopathy is seen.  No acute osseous abnormalities are identified. Prominent cysts are seen at the left femoral head and neck.  IMPRESSION: No acute abnormalities seen within the abdomen or pelvis.   Electronically Signed   By: Roanna RaiderJeffery  Chang M.D.   On: 12/11/2014 21:41     EKG Interpretation None      MDM   Final diagnoses:  Right lower quadrant abdominal pain   Patient has right lower quadrant pain that radiates  to the back. Her hemoglobin is 10.5 which she says is normal.  She is at the end of her menstrual cycle. UA shows moderate Hgb which is likely related to her menstrual cycle.  Otherwise, her CBC and BMP are unremarkable.  Urine pregnancy is negative.   CT scan of the abdomen shows no acute abnormality in the abdomen or pelvis. No signs of appendicitis, renal, or kidney stones.  I have given her pain medication and she is to f/u with her PCP.    Catha Gosselin, PA-C 12/12/14 1038  Jerelyn Scott, MD 12/13/14 1510

## 2015-02-14 ENCOUNTER — Encounter (HOSPITAL_COMMUNITY): Payer: Self-pay | Admitting: Emergency Medicine

## 2015-02-14 ENCOUNTER — Emergency Department (HOSPITAL_COMMUNITY)
Admission: EM | Admit: 2015-02-14 | Discharge: 2015-02-15 | Disposition: A | Discharge status: Home / Self Care | Payer: Medicaid Other | Attending: Emergency Medicine | Admitting: Emergency Medicine

## 2015-02-14 DIAGNOSIS — J45909 Unspecified asthma, uncomplicated: Secondary | ICD-10-CM | POA: Diagnosis not present

## 2015-02-14 DIAGNOSIS — Z79899 Other long term (current) drug therapy: Secondary | ICD-10-CM | POA: Diagnosis not present

## 2015-02-14 DIAGNOSIS — G8929 Other chronic pain: Secondary | ICD-10-CM | POA: Diagnosis not present

## 2015-02-14 DIAGNOSIS — Z720 Tobacco use: Secondary | ICD-10-CM | POA: Insufficient documentation

## 2015-02-14 DIAGNOSIS — M25552 Pain in left hip: Secondary | ICD-10-CM | POA: Diagnosis present

## 2015-02-14 DIAGNOSIS — Z880 Allergy status to penicillin: Secondary | ICD-10-CM | POA: Diagnosis not present

## 2015-02-14 DIAGNOSIS — Z791 Long term (current) use of non-steroidal anti-inflammatories (NSAID): Secondary | ICD-10-CM | POA: Insufficient documentation

## 2015-02-14 NOTE — ED Notes (Signed)
Pt states she was seen at [facility] and states that she was dx with multiple cysts on her L femoral neck which are now causing her pain. Alert and oriented.

## 2015-02-15 MED ORDER — KETOROLAC TROMETHAMINE 30 MG/ML IJ SOLN
30.0000 mg | Freq: Once | INTRAMUSCULAR | Status: AC
  Administered 2015-02-15: 30 mg via INTRAMUSCULAR
  Filled 2015-02-15: qty 1

## 2015-02-15 MED ORDER — IBUPROFEN 600 MG PO TABS: 600.0000 mg | ORAL_TABLET | Freq: Four times a day (QID) | ORAL | Status: DC | PRN

## 2015-02-15 NOTE — Discharge Instructions (Signed)
Please use Ibuprofen or Tylenol as needed for pain. Please follow up with orthopedist for further evaluation and management

## 2015-02-15 NOTE — ED Provider Notes (Signed)
CSN: [id number]     Arrival date & time 02/14/15  2226 History   First MD Initiated Contact with Patient 02/14/15 2330     Chief Complaint  Patient presents with  . Hip Pain   HPI  34 year old female presents with left hip pain. Patient reports over the last 6-8 months she's experienced left hip pain described as "achy" and "sharp" that has radiated into her groin. She reports this is worse after long periods at work, and has been intermittent. Patient reports that she was seen at [facility] for unrelated symptoms (right quadrant pain) with incidental prominent cysts at the left femoral head and neck. Pt reports that at the time of scans she was not having symptoms but over the last 2 days she has experienced left hip pain. She denies trauma to the hip, fever, radiation of pain, distal loss of some sensation of strength or function. Patient has no additional complaints other than those noted above.  Past Medical History  Diagnosis Date  . Asthma    Past Surgical History  Procedure Laterality Date  . Cholecystectomy    . Tubal ligation     History reviewed. No pertinent family history. History  Substance Use Topics  . Smoking status: Current Some Day Smoker    Types: Cigarettes  . Smokeless tobacco: Not on file  . Alcohol Use: Yes     Comment: occassionally   OB History    No data available     Review of Systems  All other systems reviewed and are negative.   Allergies  Dilaudid; Penicillins; and Hydrocodone  Home Medications   Prior to Admission medications   Medication Sig Start Date End Date Taking? Authorizing Provider  oxyCODONE (ROXICODONE) 15 MG immediate release tablet Take 15 mg by mouth 4 (four) times daily as needed for pain.  02/07/15  Yes Historical Provider, MD  HYDROcodone-acetaminophen (NORCO/VICODIN) 5-325 MG per tablet Take 1 tablet by mouth every 6 (six) hours as needed for moderate pain or severe pain. Patient not taking: Reported on  09/30/2014 03/15/14   [name], MD  ibuprofen (ADVIL,MOTRIN) 600 MG tablet Take 1 tablet (600 mg total) by mouth every 6 (six) hours as needed. Patient not taking: Reported on 09/30/2014 03/15/14   [name], MD  naproxen (NAPROSYN) 500 MG tablet Take 1 tablet (500 mg total) by mouth 2 (two) times daily. Patient not taking: Reported on 02/14/2015 12/11/14   Catha GosselinHanna Patel-Mills, PA-C  ondansetron (ZOFRAN) 4 MG tablet Take 1 tablet (4 mg total) by mouth every 6 (six) hours. Patient not taking: Reported on 02/14/2015 09/30/14   Elpidio AnisShari Upstill, PA-C  ondansetron (ZOFRAN-ODT) 8 MG disintegrating tablet Take 1 tablet (8 mg total) by mouth every 8 (eight) hours as needed for nausea or vomiting. Patient not taking: Reported on 09/30/2014 03/06/14   Marisa Severinlga Otter, MD  oxyCODONE-acetaminophen (PERCOCET/ROXICET) 5-325 MG per tablet Take 1-2 tablets by mouth every 6 (six) hours as needed for severe pain. Patient not taking: Reported on 09/30/2014 03/06/14   Marisa Severinlga Otter, MD  oxyCODONE-acetaminophen (PERCOCET/ROXICET) 5-325 MG per tablet Take 1-2 tablets by mouth every 4 (four) hours as needed for severe pain. 09/30/14   Shari Upstill, PA-C   BP 127/80 mmHg  Pulse 91  Temp(Src) 98.8 F (37.1 C) (Oral)  SpO2 100%  LMP 01/27/2015 (Approximate) Physical Exam  Constitutional: She is oriented to person, place, and time. She appears well-developed and well-nourished.  HENT:  Head: Normocephalic and atraumatic.  Eyes:  Conjunctivae are normal. Pupils are equal, round, and reactive to light. Right eye exhibits no discharge. Left eye exhibits no discharge. No scleral icterus.  Neck: Normal range of motion. No JVD present. No tracheal deviation present.  Pulmonary/Chest: Effort normal. No stridor.  Musculoskeletal:  Full active range of motion hip knee and ankle, no loss of distal sensation strength or function perfusion. Patient tender to palpation of lateral hip, no signs of trauma, deformity, skin changes.  Neurological:  She is alert and oriented to person, place, and time. Coordination normal.  Psychiatric: She has a normal mood and affect. Her behavior is normal. Judgment and thought content normal.  Nursing note and vitals reviewed.   ED Course  Procedures (including critical care time) Labs Review Labs Reviewed - No data to display  Imaging Review No results found.   EKG Interpretation None      MDM   Final diagnoses:  Hip pain, left    Labs: None  Imaging: None  Consults: None  Therapeutics: Toradol  Assessment: Hip pain  Plan: Patient presents with left hip pain, chronic in nature but acutely worsening. Patient's pain described as achy in the hip with radiation around to the groin, likely arthritic. Patient has no history of trauma, or any other concerning findings that would indicate further emergent evaluation. CT scan scans incidentally note cysts on the left more head and neck, uncertain if this is contributing to the patients symptoms that she was not having minimal symptoms and findings were noted. Patient was given a injection of Toradol and ED and instructed to follow up with orthopedics for further evaluation and management. Return precautions given, patient verbalized understanding and agreement today paln and had no further questions or concerns.      Eyvonne MechanicJeffrey Tinesha Siegrist, PA-C 02/15/15 [phone]  Marisa Severinlga Otter, MD 02/15/15 [phone]

## 2015-02-15 NOTE — ED Notes (Signed)
Pt states "I am ready to go" pt sitting in wheelchair; pt declines to wait any length of time after Toradol injection and declines vital signs

## 2015-02-27 ENCOUNTER — Emergency Department (HOSPITAL_COMMUNITY)
Admission: EM | Admit: 2015-02-27 | Discharge: 2015-02-27 | Disposition: A | Discharge status: Home / Self Care | Payer: Medicaid Other | Attending: Emergency Medicine | Admitting: Emergency Medicine

## 2015-02-27 ENCOUNTER — Encounter (HOSPITAL_COMMUNITY): Payer: Self-pay | Admitting: Emergency Medicine

## 2015-02-27 DIAGNOSIS — Z880 Allergy status to penicillin: Secondary | ICD-10-CM | POA: Diagnosis not present

## 2015-02-27 DIAGNOSIS — J45909 Unspecified asthma, uncomplicated: Secondary | ICD-10-CM | POA: Insufficient documentation

## 2015-02-27 DIAGNOSIS — Z720 Tobacco use: Secondary | ICD-10-CM | POA: Insufficient documentation

## 2015-02-27 DIAGNOSIS — M25552 Pain in left hip: Secondary | ICD-10-CM | POA: Diagnosis not present

## 2015-02-27 MED ORDER — OXYCODONE-ACETAMINOPHEN 5-325 MG PO TABS: 1.0000 | ORAL_TABLET | ORAL | Status: DC | PRN

## 2015-02-27 MED ORDER — OXYCODONE HCL 5 MG PO TABS
15.0000 mg | ORAL_TABLET | Freq: Once | ORAL | Status: AC
  Administered 2015-02-27: 15 mg via ORAL
  Filled 2015-02-27: qty 3

## 2015-02-27 MED ORDER — KETOROLAC TROMETHAMINE 30 MG/ML IJ SOLN
60.0000 mg | Freq: Once | INTRAMUSCULAR | Status: AC
  Administered 2015-02-27: 60 mg via INTRAMUSCULAR
  Filled 2015-02-27: qty 2

## 2015-02-27 NOTE — Discharge Instructions (Signed)
We have ordered the ultrasound of your right leg, expect a call from someone tomorrow to schedule the scan. Please call your primary care doctor soon or further pain control.

## 2015-02-27 NOTE — ED Provider Notes (Signed)
CSN: [id number]     Arrival date & time 02/27/15  0038 History  This chart was scribed for Derwood KaplanAnkit Lizvet Chunn, MD by Octavia HeirArianna Nassar, ED Scribe. This patient was seen in room A13C/A13C and the patient's care was started at 3:14 AM.    Chief Complaint  Patient presents with  . Hip Pain     The history is provided by the patient. No language interpreter was used.     HPI Comments: Shelby Leach is a 34 y.o. female who presents to the Emergency Department complaining of constant, gradual worsening left sided hip pain that radiates to her abdomen 3 nights ago. Pt ran out of her pain medication, oxycodone 15 mg, 5 days ago and the pain started shortly after. 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 Pt has her next appointment in 2 weeks. She denies nausea, vomiting, fever and chills. Pt is allergic to Dilaudid. Also reports leg swelling, R side that is new. No hx of DVT, PE, no dib.  Past Medical History  Diagnosis Date  . Asthma    Past Surgical History  Procedure Laterality Date  . Cholecystectomy    . Tubal ligation     No family history on file. History  Substance Use Topics  . Smoking status: Current Some Day Smoker    Types: Cigarettes  . Smokeless tobacco: Not on file  . Alcohol Use: Yes     Comment: occassionally   OB History    No data available     Review of Systems  Constitutional: Negative for fever and chills.  Gastrointestinal: Positive for abdominal pain. Negative for nausea and vomiting.  Musculoskeletal: Positive for arthralgias.  All other systems reviewed and are negative.     Allergies  Dilaudid; Penicillins; and Hydrocodone  Home Medications   Prior to Admission medications   Medication Sig Start Date End Date Taking? Authorizing Provider  HYDROcodone-acetaminophen (NORCO/VICODIN) 5-325 MG per tablet Take 1 tablet by mouth every 6 (six) hours as needed for moderate pain or severe  pain. Patient not taking: Reported on 09/30/2014 03/15/14   [name], MD  ibuprofen (ADVIL,MOTRIN) 600 MG tablet Take 1 tablet (600 mg total) by mouth every 6 (six) hours as needed. 02/15/15   Eyvonne MechanicJeffrey Hedges, PA-C  naproxen (NAPROSYN) 500 MG tablet Take 1 tablet (500 mg total) by mouth 2 (two) times daily. Patient not taking: Reported on 02/14/2015 12/11/14   Catha GosselinHanna Patel-Mills, PA-C  ondansetron (ZOFRAN) 4 MG tablet Take 1 tablet (4 mg total) by mouth every 6 (six) hours. Patient not taking: Reported on 02/14/2015 09/30/14   Elpidio AnisShari Upstill, PA-C  ondansetron (ZOFRAN-ODT) 8 MG disintegrating tablet Take 1 tablet (8 mg total) by mouth every 8 (eight) hours as needed for nausea or vomiting. Patient not taking: Reported on 09/30/2014 03/06/14   Marisa Severinlga Otter, MD  oxyCODONE (ROXICODONE) 15 MG immediate release tablet Take 15 mg by mouth 4 (four) times daily as needed for pain.  02/07/15   Historical Provider, MD  oxyCODONE-acetaminophen (PERCOCET/ROXICET) 5-325 MG per tablet Take 1 tablet by mouth every 4 (four) hours as needed for severe pain. 02/27/15   Derwood KaplanAnkit Glendine Swetz, MD   Triage vitals: BP 122/71 mmHg  Pulse 83  Temp(Src) 98.6 F (37 C) (Oral)  Resp 14  Ht 5\' 3"  (1.6 m)  Wt 263 lb (119.296 kg)  BMI 46.60 kg/m2  SpO2 98%  LMP 02/19/2015 Physical Exam  Constitutional: She  is oriented to person, place, and time. She appears well-developed and well-nourished.  HENT:  Head: Normocephalic.  Eyes: Conjunctivae are normal. Pupils are equal, round, and reactive to light.  Neck: Normal range of motion. Neck supple.  Cardiovascular: Normal rate and regular rhythm.   Pulmonary/Chest: Effort normal and breath sounds normal.  Abdominal: Soft. Bowel sounds are normal.  Musculoskeletal: Normal range of motion.  Anterior hip tenderness proximal femur tenderness with palpation No significant deformity or edema of left thigh RLE unilateral swelling  Neurological: She is alert and oriented to person, place, and  time.  Skin: Skin is warm and dry.  Psychiatric: She has a normal mood and affect. Her behavior is normal.  Nursing note and vitals reviewed.   ED Course  Procedures  DIAGNOSTIC STUDIES: Oxygen Saturation is 98% on RA, normal by my interpretation.  COORDINATION OF CARE:  3:20 AM Discussed treatment plan which includes shot of pain medication with pt at bedside and pt agreed to plan.   Labs Review Labs Reviewed - No data to display  Imaging Review No results found.   EKG Interpretation None      MDM   Final diagnoses:  Acute hip pain, left    I personally performed the services described in this documentation, which was scribed in my presence. The recorded information has been reviewed and is accurate.  PT with hip pain. 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 Pain severe now, especially with ambulation. Will give oral meds. 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   Derwood Kaplan, MD 02/27/15 [phone]

## 2015-02-27 NOTE — ED Notes (Signed)
Family at bedside. 

## 2015-02-27 NOTE — ED Notes (Signed)
Patient is alert and orientedx4.  Patient was explained discharge instructions and they understood them with no questions.  The patient's husband, [name] is taking the patient home.

## 2015-02-27 NOTE — ED Notes (Signed)
Pt. 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

## 2015-04-29 ENCOUNTER — Emergency Department (HOSPITAL_COMMUNITY)
Admission: EM | Admit: 2015-04-29 | Discharge: 2015-04-29 | Discharge status: Against Medical Advice | Payer: Medicaid Other | Attending: Emergency Medicine | Admitting: Emergency Medicine

## 2015-04-29 ENCOUNTER — Emergency Department (HOSPITAL_COMMUNITY): Payer: Medicaid Other

## 2015-04-29 ENCOUNTER — Encounter (HOSPITAL_COMMUNITY): Payer: Self-pay | Admitting: Emergency Medicine

## 2015-04-29 DIAGNOSIS — R6883 Chills (without fever): Secondary | ICD-10-CM | POA: Insufficient documentation

## 2015-04-29 DIAGNOSIS — J441 Chronic obstructive pulmonary disease with (acute) exacerbation: Secondary | ICD-10-CM

## 2015-04-29 DIAGNOSIS — R0981 Nasal congestion: Secondary | ICD-10-CM | POA: Diagnosis not present

## 2015-04-29 DIAGNOSIS — R52 Pain, unspecified: Secondary | ICD-10-CM | POA: Insufficient documentation

## 2015-04-29 DIAGNOSIS — Z79899 Other long term (current) drug therapy: Secondary | ICD-10-CM | POA: Diagnosis not present

## 2015-04-29 DIAGNOSIS — R1110 Vomiting, unspecified: Secondary | ICD-10-CM | POA: Diagnosis present

## 2015-04-29 DIAGNOSIS — Z720 Tobacco use: Secondary | ICD-10-CM | POA: Diagnosis not present

## 2015-04-29 DIAGNOSIS — R05 Cough: Secondary | ICD-10-CM

## 2015-04-29 DIAGNOSIS — Z880 Allergy status to penicillin: Secondary | ICD-10-CM | POA: Insufficient documentation

## 2015-04-29 DIAGNOSIS — R059 Cough, unspecified: Secondary | ICD-10-CM

## 2015-04-29 MED ORDER — PREDNISONE 20 MG PO TABS: 60.0000 mg | ORAL_TABLET | Freq: Every day | ORAL | Status: DC

## 2015-04-29 MED ORDER — PREDNISONE 20 MG PO TABS
60.0000 mg | ORAL_TABLET | Freq: Once | ORAL | Status: AC
  Administered 2015-04-29: 60 mg via ORAL
  Filled 2015-04-29: qty 3

## 2015-04-29 MED ORDER — SODIUM CHLORIDE 0.9 % IV BOLUS (SEPSIS)
1000.0000 mL | Freq: Once | INTRAVENOUS | Status: AC
  Administered 2015-04-29: 1000 mL via INTRAVENOUS

## 2015-04-29 MED ORDER — METOCLOPRAMIDE HCL 5 MG/ML IJ SOLN
10.0000 mg | Freq: Once | INTRAMUSCULAR | Status: AC
  Administered 2015-04-29: 10 mg via INTRAVENOUS
  Filled 2015-04-29: qty 2

## 2015-04-29 MED ORDER — KETOROLAC TROMETHAMINE 30 MG/ML IJ SOLN
30.0000 mg | Freq: Once | INTRAMUSCULAR | Status: AC
  Administered 2015-04-29: 30 mg via INTRAVENOUS
  Filled 2015-04-29: qty 1

## 2015-04-29 MED ORDER — IPRATROPIUM BROMIDE 0.02 % IN SOLN
1.0000 mg | Freq: Once | RESPIRATORY_TRACT | Status: AC
  Administered 2015-04-29: 1 mg via RESPIRATORY_TRACT
  Filled 2015-04-29: qty 5

## 2015-04-29 MED ORDER — ALBUTEROL (5 MG/ML) CONTINUOUS INHALATION SOLN
10.0000 mg/h | INHALATION_SOLUTION | RESPIRATORY_TRACT | Status: DC
  Administered 2015-04-29: 10 mg/h via RESPIRATORY_TRACT
  Filled 2015-04-29: qty 20

## 2015-04-29 NOTE — Discharge Instructions (Signed)
Chronic Obstructive Pulmonary Disease Exacerbation Shelby Leach, you are leaving against medical advise.  Your breathing could become worse and you could stop breathing.  Come back to the ED immediately if you want to resume treatment.  If not, see your primary doctor ASAP for close follow up.  Take prednisone to help with your breathing.  Thank you.  Chronic obstructive pulmonary disease (COPD) is a common lung problem. In COPD, the flow of air from the lungs is limited. COPD exacerbations are times that breathing gets worse and you need extra treatment. Without treatment they can be life threatening. If they happen often, your lungs can become more damaged. HOME CARE  Do not smoke.  Avoid tobacco smoke and other things that bother your lungs.  If given, take your antibiotic medicine as told. Finish the medicine even if you start to feel better.  Only take medicines as told by your doctor.  Drink enough fluids to keep your pee (urine) clear or pale yellow (unless your doctor has told you not to).  Use a cool mist machine (vaporizer).  If you use oxygen or a machine that turns liquid medicine into a mist (nebulizer), continue to use them as told.  Keep up with shots (vaccinations) as told by your doctor.  Exercise regularly.  Eat healthy foods.  Keep all doctor visits as told. GET HELP RIGHT AWAY IF:  You are very short of breath and it gets worse.  You have trouble talking.  You have bad chest pain.  You have blood in your spit (sputum).  You have a fever.  You keep throwing up (vomiting).  You feel weak, or you pass out (faint).  You feel confused.  You keep getting worse. MAKE SURE YOU:   Understand these instructions.  Will watch your condition.  Will get help right away if you are not doing well or get worse. Document Released: 09/05/2011 Document Revised: 07/07/2013 Document Reviewed: 05/21/2013 [facility] Patient Information 2015 [place], [place]. This  information is not intended to replace advice given to you by your health care provider. Make sure you discuss any questions you have with your health care provider.

## 2015-04-29 NOTE — ED Notes (Addendum)
Pt presents with emesis (x2 in the last 24 hours) and chills. Denies nausea at this time and denies diarrhea. C/o sinus pressure and nasal congestion. Also c/o generalized body aches. Attempted using Vix cold medicine and "some other kind of medicine" with no alleviation of symptoms. No SOB noted. RR even/unlabored. No other c/c.

## 2015-04-29 NOTE — ED Provider Notes (Signed)
CSN: [id number]     Arrival date & time 04/29/15  0036 History   This chart was scribed for Tomasita Crumble, MD by Octavia Heir, ED Scribe. This patient was seen in room WA14/WA14 and the patient's care was started at 1:17 AM.    Chief Complaint  Patient presents with  . Emesis  . Chills  . Nasal Congestion      The history is provided by the patient. No language interpreter was used.   HPI Comments: Shelby Leach is a 34 y.o. female who has a hx of COPD and Asthma presents to the Emergency Department complaining of constant gradual worsening nasal congestion onset 2 days ago. Pt has associated dry throat, chills, vomiting, and generalized body aches. She states she feels dehydrated. Pt took some OTC Vicks tablets and "other cold medicine" to alleviate the symptoms with no relief. Per husband, he has received similar symptoms from the patient. Pt denies fever.  Past Medical History  Diagnosis Date  . Asthma    Past Surgical History  Procedure Laterality Date  . Cholecystectomy    . Tubal ligation     History reviewed. No pertinent family history. History  Substance Use Topics  . Smoking status: Current Some Day Smoker    Types: Cigarettes  . Smokeless tobacco: Not on file  . Alcohol Use: Yes     Comment: occassionally   OB History    No data available     Review of Systems  A complete 10 system review of systems was obtained and all systems are negative except as noted in the HPI and PMH.    Allergies  Dilaudid; Penicillins; and Hydrocodone  Home Medications   Prior to Admission medications   Medication Sig Start Date End Date Taking? Authorizing Provider  lisinopril-hydrochlorothiazide (PRINZIDE,ZESTORETIC) 10-12.5 MG per tablet Take 1 tablet by mouth daily.   Yes Historical Provider, MD  oxyCODONE (ROXICODONE) 15 MG immediate release tablet Take 15 mg by mouth 4 (four) times daily as needed for pain.  02/07/15  Yes Historical Provider, MD   HYDROcodone-acetaminophen (NORCO/VICODIN) 5-325 MG per tablet Take 1 tablet by mouth every 6 (six) hours as needed for moderate pain or severe pain. Patient not taking: Reported on 09/30/2014 03/15/14   Shon Baton, MD  ibuprofen (ADVIL,MOTRIN) 600 MG tablet Take 1 tablet (600 mg total) by mouth every 6 (six) hours as needed. 02/15/15   Eyvonne Mechanic, PA-C  naproxen (NAPROSYN) 500 MG tablet Take 1 tablet (500 mg total) by mouth 2 (two) times daily. Patient not taking: Reported on 02/14/2015 12/11/14   Catha Gosselin, PA-C  ondansetron (ZOFRAN) 4 MG tablet Take 1 tablet (4 mg total) by mouth every 6 (six) hours. Patient not taking: Reported on 02/14/2015 09/30/14   Elpidio Anis, PA-C  ondansetron (ZOFRAN-ODT) 8 MG disintegrating tablet Take 1 tablet (8 mg total) by mouth every 8 (eight) hours as needed for nausea or vomiting. Patient not taking: Reported on 09/30/2014 03/06/14   Marisa Severin, MD  oxyCODONE-acetaminophen (PERCOCET/ROXICET) 5-325 MG per tablet Take 1 tablet by mouth every 4 (four) hours as needed for severe pain. Patient not taking: Reported on 04/29/2015 02/27/15   Derwood Kaplan, MD   Triage vitals: BP 123/78 mmHg  Pulse 67  Temp(Src) 97.8 F (36.6 C) (Oral)  Resp 17  SpO2 100%  LMP 04/23/2015 (Exact Date) Physical Exam  Constitutional: She is oriented to person, place, and time. She appears well-developed and well-nourished. No distress.  HENT:  Head: Normocephalic and  atraumatic.  Nose: Nose normal.  Mouth/Throat: Oropharynx is clear and moist. No oropharyngeal exudate.  Eyes: Conjunctivae and EOM are normal. Pupils are equal, round, and reactive to light. No scleral icterus.  Neck: Normal range of motion. Neck supple. No JVD present. No tracheal deviation present. No thyromegaly present.  Cardiovascular: Normal rate, regular rhythm and normal heart sounds.  Exam reveals no gallop and no friction rub.   No murmur heard. Pulmonary/Chest: Effort normal and breath sounds  normal. No respiratory distress. She has no wheezes. She exhibits no tenderness.  Mild wheezing bilateral and posterior mostly upper airway noises  Abdominal: Soft. Bowel sounds are normal. She exhibits no distension and no mass. There is no tenderness. There is no rebound and no guarding.  Musculoskeletal: Normal range of motion. She exhibits no edema or tenderness.  Lymphadenopathy:    She has no cervical adenopathy.  Neurological: She is alert and oriented to person, place, and time. No cranial nerve deficit. She exhibits normal muscle tone.  Skin: Skin is warm and dry. No rash noted. No erythema. No pallor.  Nursing note and vitals reviewed.   ED Course  Procedures  DIAGNOSTIC STUDIES: Oxygen Saturation is 100% on RA, normal by my interpretation.  COORDINATION OF CARE:  1:20 AM Discussed treatment plan which includes breathing treatments and CXR with pt at bedside and pt agreed to plan.  Labs Review Labs Reviewed - No data to display  Imaging Review Dg Chest 2 View  04/29/2015   CLINICAL DATA:  Chills and emesis.  EXAM: CHEST  2 VIEW  COMPARISON:  03/15/2014  FINDINGS: The heart size and mediastinal contours are within normal limits. Both lungs are clear. The visualized skeletal structures are unremarkable.  IMPRESSION: No active cardiopulmonary disease.   Electronically Signed   By: Ellery Plunk M.D.   On: 04/29/2015 02:11     EKG Interpretation None      MDM   Final diagnoses:  None   Patient presents emergency department for shortness of breath, coughing, and congestion. Physical exam reveals some wheezing, long history of COPD, patient was given albuterol, ipratropium, prednisone for treatment. Also given 1 L IV fluids. Will obtain chest x-ray to evaluate for pneumonia.  Patient also given Toradol and Reglan for symptomatic control.  Patient is requesting to leave AMA. She did receive Toradol, Reglan. She refused IV fluids and took off her continuous breathing  treatment. Patient did receive prednisone as well. She is aware she could have respiratory failure if she goes home. We'll discharge with prednisone course and advised patient to see primary care physician as soon as possible.   I personally performed the services described in this documentation, which was scribed in my presence. The recorded information has been reviewed and is accurate.   Tomasita Crumble, MD 04/29/15 [phone]

## 2015-09-19 ENCOUNTER — Encounter (HOSPITAL_COMMUNITY): Payer: Self-pay | Admitting: Emergency Medicine

## 2015-09-19 ENCOUNTER — Emergency Department (HOSPITAL_COMMUNITY)
Admission: EM | Admit: 2015-09-19 | Discharge: 2015-09-20 | Disposition: A | Discharge status: Home / Self Care | Payer: Medicaid Other | Attending: Emergency Medicine | Admitting: Emergency Medicine

## 2015-09-19 DIAGNOSIS — J45909 Unspecified asthma, uncomplicated: Secondary | ICD-10-CM | POA: Diagnosis not present

## 2015-09-19 DIAGNOSIS — Z880 Allergy status to penicillin: Secondary | ICD-10-CM | POA: Diagnosis not present

## 2015-09-19 DIAGNOSIS — F17210 Nicotine dependence, cigarettes, uncomplicated: Secondary | ICD-10-CM | POA: Insufficient documentation

## 2015-09-19 DIAGNOSIS — Z3202 Encounter for pregnancy test, result negative: Secondary | ICD-10-CM | POA: Diagnosis not present

## 2015-09-19 DIAGNOSIS — R531 Weakness: Secondary | ICD-10-CM

## 2015-09-19 DIAGNOSIS — Z79899 Other long term (current) drug therapy: Secondary | ICD-10-CM | POA: Insufficient documentation

## 2015-09-19 DIAGNOSIS — R197 Diarrhea, unspecified: Secondary | ICD-10-CM | POA: Insufficient documentation

## 2015-09-19 DIAGNOSIS — R42 Dizziness and giddiness: Secondary | ICD-10-CM | POA: Insufficient documentation

## 2015-09-19 DIAGNOSIS — R112 Nausea with vomiting, unspecified: Secondary | ICD-10-CM

## 2015-09-19 LAB — URINALYSIS, ROUTINE W REFLEX MICROSCOPIC
Bilirubin Urine: NEGATIVE
GLUCOSE, UA: NEGATIVE mg/dL
Ketones, ur: NEGATIVE mg/dL
Nitrite: NEGATIVE
Protein, ur: NEGATIVE mg/dL
Specific Gravity, Urine: 1.02 (ref 1.005–1.030)
pH: 6 (ref 5.0–8.0)

## 2015-09-19 LAB — I-STAT BETA HCG BLOOD, ED (MC, WL, AP ONLY)

## 2015-09-19 LAB — CBC
HEMATOCRIT: 35.6 % — AB (ref 36.0–46.0)
Hemoglobin: 11.2 g/dL — ABNORMAL LOW (ref 12.0–15.0)
MCH: 24 pg — AB (ref 26.0–34.0)
MCHC: 31.5 g/dL (ref 30.0–36.0)
MCV: 76.4 fL — AB (ref 78.0–100.0)
Platelets: 293 10*3/uL (ref 150–400)
RBC: 4.66 MIL/uL (ref 3.87–5.11)
RDW: 16.1 % — ABNORMAL HIGH (ref 11.5–15.5)
WBC: 9.1 10*3/uL (ref 4.0–10.5)

## 2015-09-19 LAB — BASIC METABOLIC PANEL
Anion gap: 11 (ref 5–15)
BUN: 8 mg/dL (ref 6–20)
CO2: 23 mmol/L (ref 22–32)
Calcium: 10.2 mg/dL (ref 8.9–10.3)
Chloride: 106 mmol/L (ref 101–111)
Creatinine, Ser: 0.7 mg/dL (ref 0.44–1.00)
GFR calc non Af Amer: >60 mL/min (ref >60)
Glucose, Bld: 99 mg/dL (ref 65–99)
POTASSIUM: 3.6 mmol/L (ref 3.5–5.1)
SODIUM: 140 mmol/L (ref 135–145)

## 2015-09-19 LAB — CBG MONITORING, ED: Glucose-Capillary: 108 mg/dL — ABNORMAL HIGH (ref 65–99)

## 2015-09-19 LAB — URINE MICROSCOPIC-ADD ON

## 2015-09-19 MED ORDER — ONDANSETRON 4 MG PO TBDP
4.0000 mg | ORAL_TABLET | Freq: Once | ORAL | Status: AC | PRN
  Administered 2015-09-19: 4 mg via ORAL
  Filled 2015-09-19: qty 1

## 2015-09-19 NOTE — ED Notes (Signed)
Patient presents for lightheaded and dizziness x2 days, had 3 episodes of emesis today with blood in first, bright red clots per patient, also c/o generalized body aches. Denies fever, chills, or diarrhea.

## 2015-09-20 MED ORDER — PROMETHAZINE HCL 25 MG PO TABS: 25.0000 mg | ORAL_TABLET | Freq: Three times a day (TID) | ORAL | Status: DC | PRN

## 2015-09-20 MED ORDER — SODIUM CHLORIDE 0.9 % IV BOLUS (SEPSIS)
1000.0000 mL | Freq: Once | INTRAVENOUS | Status: AC
  Administered 2015-09-20: 1000 mL via INTRAVENOUS

## 2015-09-20 MED ORDER — SODIUM CHLORIDE 0.9 % IV BOLUS (SEPSIS): 1000.0000 mL | Freq: Once | INTRAVENOUS | Status: DC

## 2015-09-20 MED ORDER — ONDANSETRON HCL 4 MG/2ML IJ SOLN
4.0000 mg | Freq: Once | INTRAMUSCULAR | Status: AC
  Administered 2015-09-20: 4 mg via INTRAVENOUS
  Filled 2015-09-20: qty 2

## 2015-09-20 NOTE — ED Notes (Signed)
Discussed pt with PA Lawyer.   Pt has ambulated to bathroom with no assistance and no falls or reports of dizziness.  Pt had orthostatic vitals done and they were unremarkable.

## 2015-09-20 NOTE — ED Provider Notes (Signed)
CSN: [id number]     Arrival date & time 09/19/15  2120 History   First MD Initiated Contact with Patient 09/19/15 2356     Chief Complaint  Patient presents with  . Dizziness  . Hematemesis     (Consider location/radiation/quality/duration/timing/severity/associated sxs/prior Treatment) HPI Patient presents to the emergency department with dizziness and lightheadedness for 2 days.  She states she had 3 episodes of vomiting today.  Patient states that she has felt generally weak and tired.  She states that nothing seems make her condition better or worse.  She states exerting herself to make her more weak.  Patient denies chest pain, shortness of breath, headache, blurred vision, back pain, neck pain, fever, cough, dysuria, incontinence, abdominal pain, near syncope, rash, or syncope.  The patient states that she did not take any medications prior to arrival Past Medical History  Diagnosis Date  . Asthma    Past Surgical History  Procedure Laterality Date  . Cholecystectomy    . Tubal ligation     No family history on file. Social History  Substance Use Topics  . Smoking status: Current Some Day Smoker    Types: Cigarettes  . Smokeless tobacco: None  . Alcohol Use: Yes     Comment: occassionally   OB History    No data available     Review of Systems   All other systems negative except as documented in the HPI. All pertinent positives and negatives as reviewed in the HPI. Allergies  Dilaudid; Penicillins; and Hydrocodone  Home Medications   Prior to Admission medications   Medication Sig Start Date End Date Taking? Authorizing Provider  lisinopril-hydrochlorothiazide (PRINZIDE,ZESTORETIC) 10-12.5 MG per tablet Take 1 tablet by mouth daily.   Yes Historical Provider, MD  oxyCODONE (ROXICODONE) 15 MG immediate release tablet Take 15 mg by mouth 4 (four) times daily as needed for pain.  02/07/15  Yes Historical Provider, MD  HYDROcodone-acetaminophen (NORCO/VICODIN) 5-325  MG per tablet Take 1 tablet by mouth every 6 (six) hours as needed for moderate pain or severe pain. Patient not taking: Reported on 09/30/2014 03/15/14   Shon Baton, MD  ibuprofen (ADVIL,MOTRIN) 600 MG tablet Take 1 tablet (600 mg total) by mouth every 6 (six) hours as needed. Patient not taking: Reported on 04/29/2015 02/15/15   Eyvonne Mechanic, PA-C  naproxen (NAPROSYN) 500 MG tablet Take 1 tablet (500 mg total) by mouth 2 (two) times daily. Patient not taking: Reported on 02/14/2015 12/11/14   Catha Gosselin, PA-C  ondansetron (ZOFRAN) 4 MG tablet Take 1 tablet (4 mg total) by mouth every 6 (six) hours. Patient not taking: Reported on 02/14/2015 09/30/14   Elpidio Anis, PA-C  ondansetron (ZOFRAN-ODT) 8 MG disintegrating tablet Take 1 tablet (8 mg total) by mouth every 8 (eight) hours as needed for nausea or vomiting. Patient not taking: Reported on 09/30/2014 03/06/14   Marisa Severin, MD  predniSONE (DELTASONE) 20 MG tablet Take 3 tablets (60 mg total) by mouth daily. 2 tabs po daily x 4 days Patient not taking: Reported on 09/19/2015 04/29/15   Tomasita Crumble, MD   BP 114/78 mmHg  Pulse 84  Temp(Src) 98.7 F (37.1 C) (Oral)  Resp 18  SpO2 100%  LMP 09/19/2015 Physical Exam  Constitutional: She is oriented to person, place, and time. She appears well-developed and well-nourished. No distress.  HENT:  Head: Normocephalic and atraumatic.  Mouth/Throat: Oropharynx is clear and moist.  Eyes: Pupils are equal, round, and reactive to light.  Neck: Normal  range of motion. Neck supple.  Cardiovascular: Normal rate, regular rhythm and normal heart sounds.  Exam reveals no gallop and no friction rub.   No murmur heard. Pulmonary/Chest: Effort normal and breath sounds normal. No respiratory distress. She has no wheezes.  Abdominal: Soft. Bowel sounds are normal. She exhibits no distension. There is no tenderness.  Neurological: She is alert and oriented to person, place, and time. She exhibits normal  muscle tone. Coordination normal.  Skin: Skin is warm and dry. No rash noted. No erythema.  Psychiatric: She has a normal mood and affect. Her behavior is normal.  Nursing note and vitals reviewed.   ED Course  Procedures (including critical care time) Labs Review Labs Reviewed  CBC - Abnormal; Notable for the following:    Hemoglobin 11.2 (*)    HCT 35.6 (*)    MCV 76.4 (*)    MCH 24.0 (*)    RDW 16.1 (*)    All other components within normal limits  URINALYSIS, ROUTINE W REFLEX MICROSCOPIC (NOT [facility]) - Abnormal; Notable for the following:    APPearance CLOUDY (*)    Hgb urine dipstick LARGE (*)    Leukocytes, UA SMALL (*)    All other components within normal limits  URINE MICROSCOPIC-ADD ON - Abnormal; Notable for the following:    Squamous Epithelial / LPF 6-30 (*)    Bacteria, UA FEW (*)    All other components within normal limits  CBG MONITORING, ED - Abnormal; Notable for the following:    Glucose-Capillary 108 (*)    All other components within normal limits  BASIC METABOLIC PANEL  I-STAT BETA HCG BLOOD, ED ([facility], [facility], [facility] ONLY)    Imaging Review No results found. I have personally reviewed and evaluated these images and lab results as part of my medical decision-making.   EKG Interpretation   Date/Time:  Tuesday September 19 2015 21:33:52 EST Ventricular Rate:  85 PR Interval:  150 QRS Duration: 94 QT Interval:  355 QTC Calculation: 422 R Axis:   44 Text Interpretation:  Sinus rhythm Borderline T wave abnormalities ok. no  acute change Confirmed by Donnald GarrePfeiffer, MD, Lebron ConnersMarcy [phone]([id number]) on 09/19/2015  9:37:56 PM      Patient is given IV fluids.  She is feeling better at this time.  She will be discharged home, told to return here as needed.  Patient agrees the plan and all questions were answered.  I feel that this could be a GI related bug based on the fact she did not have vomiting and diarrhea   Charlestine NightChristopher Terryl Molinelli, PA-C 09/20/15 0321  Devoria AlbeIva Knapp, MD 09/20/15  [phone]

## 2015-09-20 NOTE — Discharge Instructions (Signed)
Return here as needed.  Follow-up with your primary care Dr. increase your fluid intake and rest as much as possible °

## 2016-05-07 ENCOUNTER — Encounter (HOSPITAL_COMMUNITY): Payer: Self-pay | Admitting: Emergency Medicine

## 2016-05-07 ENCOUNTER — Emergency Department (HOSPITAL_COMMUNITY)
Admission: EM | Admit: 2016-05-07 | Discharge: 2016-05-07 | Disposition: A | Discharge status: Home / Self Care | Payer: Medicaid Other | Attending: Emergency Medicine | Admitting: Emergency Medicine

## 2016-05-07 DIAGNOSIS — F17210 Nicotine dependence, cigarettes, uncomplicated: Secondary | ICD-10-CM | POA: Diagnosis not present

## 2016-05-07 DIAGNOSIS — Z79899 Other long term (current) drug therapy: Secondary | ICD-10-CM | POA: Insufficient documentation

## 2016-05-07 DIAGNOSIS — R002 Palpitations: Secondary | ICD-10-CM | POA: Diagnosis present

## 2016-05-07 DIAGNOSIS — D509 Iron deficiency anemia, unspecified: Secondary | ICD-10-CM

## 2016-05-07 DIAGNOSIS — J45909 Unspecified asthma, uncomplicated: Secondary | ICD-10-CM | POA: Diagnosis not present

## 2016-05-07 LAB — CBC WITH DIFFERENTIAL/PLATELET
Basophils Absolute: 0 10*3/uL (ref 0.0–0.1)
Basophils Relative: 0 %
EOS PCT: 4 %
Eosinophils Absolute: 0.3 10*3/uL (ref 0.0–0.7)
HEMATOCRIT: 29.8 % — AB (ref 36.0–46.0)
HEMOGLOBIN: 9.2 g/dL — AB (ref 12.0–15.0)
LYMPHS PCT: 43 %
Lymphs Abs: 3.6 10*3/uL (ref 0.7–4.0)
MCH: 22.7 pg — ABNORMAL LOW (ref 26.0–34.0)
MCHC: 30.9 g/dL (ref 30.0–36.0)
MCV: 73.4 fL — ABNORMAL LOW (ref 78.0–100.0)
MONOS PCT: 6 %
Monocytes Absolute: 0.5 10*3/uL (ref 0.1–1.0)
NEUTROS PCT: 47 %
Neutro Abs: 4 10*3/uL (ref 1.7–7.7)
Platelets: 208 10*3/uL (ref 150–400)
RBC: 4.06 MIL/uL (ref 3.87–5.11)
RDW: 18.1 % — AB (ref 11.5–15.5)
WBC: 8.4 10*3/uL (ref 4.0–10.5)

## 2016-05-07 LAB — I-STAT CHEM 8, ED
BUN: 10 mg/dL (ref 6–20)
CREATININE: 0.7 mg/dL (ref 0.44–1.00)
Calcium, Ion: 1.12 mmol/L — ABNORMAL LOW (ref 1.13–1.30)
Chloride: 103 mmol/L (ref 101–111)
Glucose, Bld: 102 mg/dL — ABNORMAL HIGH (ref 65–99)
HEMATOCRIT: 31 % — AB (ref 36.0–46.0)
HEMOGLOBIN: 10.5 g/dL — AB (ref 12.0–15.0)
POTASSIUM: 5.5 mmol/L — AB (ref 3.5–5.1)
Sodium: 138 mmol/L (ref 135–145)
TCO2: 30 mmol/L (ref 0–100)

## 2016-05-07 LAB — I-STAT TROPONIN, ED: Troponin i, poc: 0 ng/mL (ref 0.00–0.08)

## 2016-05-07 MED ORDER — SODIUM CHLORIDE 0.9 % IV BOLUS (SEPSIS): 1000.0000 mL | Freq: Once | INTRAVENOUS | Status: DC

## 2016-05-07 MED ORDER — LORAZEPAM 1 MG PO TABS: 1.0000 mg | ORAL_TABLET | Freq: Three times a day (TID) | ORAL | 0 refills | Status: AC | PRN

## 2016-05-07 NOTE — Discharge Instructions (Signed)
Talk with your doctor about possible Holter Monitor or Event Monitor.

## 2016-05-07 NOTE — ED Notes (Signed)
EKG given to Dr. Glick. 

## 2016-05-07 NOTE — ED Notes (Signed)
Patient is A & O x4.  She stated she did not want pain medication.  She is ambulatory.

## 2016-05-07 NOTE — ED Provider Notes (Signed)
[facility]-EMERGENCY DEPT Provider Note   CSN: [id number] Arrival date & time: 05/07/16  0344  First Provider Contact:  First MD Initiated Contact with Patient 05/07/16 0601        History   Chief Complaint Chief Complaint  Patient presents with  . Palpitations    HPI Shelby Leach is a 35 y.o. female.  The history is provided by the patient.  Palpitations    She has a history of asthma/COPD. Starting last night, she noted palpitations. She describes her heart racing and fluttering. Symptoms would wax and wane and would generally be present for about 10 minutes before resolving. There is some associated dyspnea but no cough and no fever. There is a pressure feeling in her chest. She denies nausea or vomiting. She has not tried any treatments at home. She is a cigarette smoker and admits to smoking one half pack a day.  Past Medical History:  Diagnosis Date  . Asthma     There are no active problems to display for this patient.   Past Surgical History:  Procedure Laterality Date  . CHOLECYSTECTOMY    . TUBAL LIGATION      OB History    No data available       Home Medications    Prior to Admission medications   Medication Sig Start Date End Date Taking? Authorizing Provider  ferrous sulfate 325 (65 FE) MG EC tablet Take 325 mg by mouth daily with breakfast.   Yes Historical Provider, MD  losartan (COZAAR) 25 MG tablet Take 25 mg by mouth daily.   Yes Historical Provider, MD  oxyCODONE (ROXICODONE) 15 MG immediate release tablet Take 15 mg by mouth 4 (four) times daily as needed for pain.  02/07/15  Yes Historical Provider, MD  HYDROcodone-acetaminophen (NORCO/VICODIN) 5-325 MG per tablet Take 1 tablet by mouth every 6 (six) hours as needed for moderate pain or severe pain. Patient not taking: Reported on 09/30/2014 03/15/14   Shon Baton, MD  ibuprofen (ADVIL,MOTRIN) 600 MG tablet Take 1 tablet (600 mg total) by mouth every 6 (six) hours as needed. Patient not  taking: Reported on 04/29/2015 02/15/15   Eyvonne Mechanic, PA-C  naproxen (NAPROSYN) 500 MG tablet Take 1 tablet (500 mg total) by mouth 2 (two) times daily. Patient not taking: Reported on 02/14/2015 12/11/14   Catha Gosselin, PA-C  ondansetron (ZOFRAN) 4 MG tablet Take 1 tablet (4 mg total) by mouth every 6 (six) hours. Patient not taking: Reported on 02/14/2015 09/30/14   Elpidio Anis, PA-C  ondansetron (ZOFRAN-ODT) 8 MG disintegrating tablet Take 1 tablet (8 mg total) by mouth every 8 (eight) hours as needed for nausea or vomiting. Patient not taking: Reported on 09/30/2014 03/06/14   Marisa Severin, MD  predniSONE (DELTASONE) 20 MG tablet Take 3 tablets (60 mg total) by mouth daily. 2 tabs po daily x 4 days Patient not taking: Reported on 09/19/2015 04/29/15   Tomasita Crumble, MD  promethazine (PHENERGAN) 25 MG tablet Take 1 tablet (25 mg total) by mouth every 8 (eight) hours as needed for nausea or vomiting. Patient not taking: Reported on 05/07/2016 09/20/15   Charlestine Night, PA-C    Family History History reviewed. No pertinent family history.  Social History Social History  Substance Use Topics  . Smoking status: Current Some Day Smoker    Types: Cigarettes  . Smokeless tobacco: Never Used  . Alcohol use Yes     Comment: occassionally     Allergies   Dilaudid [hydromorphone  hcl]; Penicillins; and Hydrocodone   Review of Systems Review of Systems  Cardiovascular: Positive for palpitations.  All other systems reviewed and are negative.    Physical Exam Updated Vital Signs Pulse 73   Temp 98.8 F (37.1 C) (Oral)   Resp 15   Ht  (1.6 m)   Wt 267 lb (121.1 kg)   LMP 04/17/2016 (Approximate)   SpO2 100%   BMI 47.30 kg/m   Physical Exam  Nursing note and vitals reviewed.  35 year old female, resting comfortably and in no acute distress. Vital signs are normal. Oxygen saturation is 100%, which is normal. Head is normocephalic and atraumatic. PERRLA, EOMI. Oropharynx is  clear. Neck is nontender and supple without adenopathy or JVD. Back is nontender and there is no CVA tenderness. Lungs have a slightly prolonged exhalation phase without overt rales, wheezes, or rhonchi. Chest is nontender. Heart has regular rate and rhythm without murmur. Abdomen is soft, flat, nontender without masses or hepatosplenomegaly and peristalsis is normoactive. Extremities have no cyanosis or edema, full range of motion is present. Skin is warm and dry without rash. Neurologic: Mental status is normal, cranial nerves are intact, there are no motor or sensory deficits.  ED Treatments / Results  Labs (all labs ordered are listed, but only abnormal results are displayed) Labs Reviewed  CBC WITH DIFFERENTIAL/PLATELET - Abnormal; Notable for the following:       Result Value   Hemoglobin 9.2 (*)    HCT 29.8 (*)    MCV 73.4 (*)    MCH 22.7 (*)    RDW 18.1 (*)    All other components within normal limits  I-STAT CHEM 8, ED - Abnormal; Notable for the following:    Potassium 5.5 (*)    Glucose, Bld 102 (*)    Calcium, Ion 1.12 (*)    Hemoglobin 10.5 (*)    HCT 31.0 (*)    All other components within normal limits  I-STAT TROPOININ, ED    EKG ECG shows normal sinus rhythm with a rate of 69, no ectopy. Normal axis. Normal P wave. Normal QRS. Normal intervals. Normal ST and T waves. Impression: normal ECG. When compared with ECG of 09/19/2015, nonspecific T-wave changes are no longer present.   Procedures Procedures (including critical care time)  Medications Ordered in ED Medications  sodium chloride 0.9 % bolus 1,000 mL (not administered)     Initial Impression / Assessment and Plan / ED Course  I have reviewed the triage vital signs and the nursing notes.  Pertinent labs & imaging results that were available during my care of the patient were reviewed by me and considered in my medical decision making (see chart for details).  Clinical Course    Subjective  palpitations. Patient expresses a feeling that her heart is racing when monitor showing her heart rate is 62-63 bpm. One PVC was noted on monitor, but ECG is normal. Willcheck electrolytes and give therapeutic trial of albuterol with ipratropium.   No clinical change with albuterol and ipratropium. Electrolytes are unremarkable. Chronic anemia is present and not significant change from baseline. I discussed with the patient the disconnect between her perception of heart racing versus what the rhythm is actually showing on monitor. She is discharged with prescription for lorazepam and is referred back to PCP to consider possible outpatient Holter monitor or event monitor.  Final Clinical Impressions(s) / ED Diagnoses   Final diagnoses:  Palpitations  Microcytic anemia    New Prescriptions  New Prescriptions   LORAZEPAM (ATIVAN) 1 MG TABLET    Take 1 tablet (1 mg total) by mouth 3 (three) times daily as needed for anxiety.     Dione Boozeavid Aleana Fifita, MD 05/07/16 [phone]

## 2016-05-07 NOTE — ED Notes (Signed)
RN Carollee HerterShannon to draw labs with iv start

## 2016-05-07 NOTE — ED Triage Notes (Signed)
Patient complaining of heart is fluttering. Patient said this started Monday 05/06/2016 @ 1800. Patient states she has had palpitations before but not like this. Patient states that she was recently diagnosed with COPD. Patient is saying that she feels that it is hard to breathe.

## 2016-05-07 NOTE — ED Notes (Signed)
Dr. Preston FleetingGlick notified that IV was not able to be obtained at this time.

## 2016-05-07 NOTE — ED Notes (Signed)
Attempted IV stick and blood draw x 2. Unsuccessful at this time.

## 2018-09-04 ENCOUNTER — Other Ambulatory Visit: Payer: Self-pay

## 2018-09-04 ENCOUNTER — Encounter (HOSPITAL_COMMUNITY): Payer: Self-pay | Admitting: Emergency Medicine

## 2018-09-04 DIAGNOSIS — R1030 Lower abdominal pain, unspecified: Secondary | ICD-10-CM | POA: Diagnosis not present

## 2018-09-04 DIAGNOSIS — Z5321 Procedure and treatment not carried out due to patient leaving prior to being seen by health care provider: Secondary | ICD-10-CM | POA: Insufficient documentation

## 2018-09-04 LAB — URINALYSIS, ROUTINE W REFLEX MICROSCOPIC
Bilirubin Urine: NEGATIVE
GLUCOSE, UA: NEGATIVE mg/dL
Ketones, ur: NEGATIVE mg/dL
NITRITE: NEGATIVE
PH: 5 (ref 5.0–8.0)
Protein, ur: 100 mg/dL — AB
SPECIFIC GRAVITY, URINE: 1.016 (ref 1.005–1.030)
WBC, UA: >50 WBC/hpf — ABNORMAL HIGH (ref 0–5)

## 2018-09-04 LAB — POC URINE PREG, ED: Preg Test, Ur: NEGATIVE

## 2018-09-04 NOTE — ED Triage Notes (Signed)
Pt from home with c/o left flank pain. Pt states this has been ongoing x 4 days. Pt denies nausea, emesis, or urinary symptoms. Pt denies diarrhea. Pt denies hx of kidney stones.

## 2018-09-05 ENCOUNTER — Emergency Department (HOSPITAL_COMMUNITY)
Admission: EM | Admit: 2018-09-05 | Discharge: 2018-09-05 | Disposition: A | Discharge status: Home / Self Care | Payer: Medicaid Other | Attending: Anesthesiology | Admitting: Anesthesiology

## 2018-09-05 NOTE — ED Notes (Addendum)
Pt called to be taken to treatment room x 2

## 2018-09-05 NOTE — ED Notes (Signed)
Pt called to be taken to treatment room x 3 with no answer  

## 2018-09-05 NOTE — ED Notes (Signed)
Pt called to be taken to treatment room with no answer  

## 2018-12-17 ENCOUNTER — Other Ambulatory Visit: Payer: Self-pay

## 2018-12-17 ENCOUNTER — Emergency Department (HOSPITAL_BASED_OUTPATIENT_CLINIC_OR_DEPARTMENT_OTHER)
Admission: EM | Admit: 2018-12-17 | Discharge: 2018-12-18 | Disposition: A | Discharge status: Home / Self Care | Payer: No Typology Code available for payment source | Attending: Emergency Medicine | Admitting: Emergency Medicine

## 2018-12-17 ENCOUNTER — Encounter (HOSPITAL_BASED_OUTPATIENT_CLINIC_OR_DEPARTMENT_OTHER): Payer: Self-pay | Admitting: *Deleted

## 2018-12-17 ENCOUNTER — Emergency Department (HOSPITAL_BASED_OUTPATIENT_CLINIC_OR_DEPARTMENT_OTHER): Payer: No Typology Code available for payment source

## 2018-12-17 DIAGNOSIS — I10 Essential (primary) hypertension: Secondary | ICD-10-CM | POA: Insufficient documentation

## 2018-12-17 DIAGNOSIS — Y999 Unspecified external cause status: Secondary | ICD-10-CM | POA: Insufficient documentation

## 2018-12-17 DIAGNOSIS — Z79899 Other long term (current) drug therapy: Secondary | ICD-10-CM | POA: Insufficient documentation

## 2018-12-17 DIAGNOSIS — M25562 Pain in left knee: Secondary | ICD-10-CM | POA: Insufficient documentation

## 2018-12-17 DIAGNOSIS — J45909 Unspecified asthma, uncomplicated: Secondary | ICD-10-CM | POA: Insufficient documentation

## 2018-12-17 DIAGNOSIS — Y92410 Unspecified street and highway as the place of occurrence of the external cause: Secondary | ICD-10-CM | POA: Diagnosis not present

## 2018-12-17 DIAGNOSIS — F17210 Nicotine dependence, cigarettes, uncomplicated: Secondary | ICD-10-CM | POA: Diagnosis not present

## 2018-12-17 DIAGNOSIS — M542 Cervicalgia: Secondary | ICD-10-CM | POA: Diagnosis present

## 2018-12-17 DIAGNOSIS — M545 Low back pain: Secondary | ICD-10-CM | POA: Diagnosis not present

## 2018-12-17 DIAGNOSIS — Y939 Activity, unspecified: Secondary | ICD-10-CM | POA: Diagnosis not present
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
## 2018-12-18 MED ORDER — NAPROXEN 500 MG PO TABS: 500.0000 mg | ORAL_TABLET | Freq: Two times a day (BID) | ORAL | 0 refills | Status: DC

## 2018-12-18 MED ORDER — METHOCARBAMOL 500 MG PO TABS: 500.0000 mg | ORAL_TABLET | Freq: Three times a day (TID) | ORAL | 0 refills | Status: DC | PRN

## 2019-09-04 ENCOUNTER — Emergency Department (HOSPITAL_BASED_OUTPATIENT_CLINIC_OR_DEPARTMENT_OTHER)
Admission: EM | Admit: 2019-09-04 | Discharge: 2019-09-04 | Disposition: A | Discharge status: Home / Self Care | Payer: Medicaid Other | Attending: Emergency Medicine | Admitting: Emergency Medicine

## 2019-09-04 ENCOUNTER — Encounter (HOSPITAL_BASED_OUTPATIENT_CLINIC_OR_DEPARTMENT_OTHER): Payer: Self-pay | Admitting: Emergency Medicine

## 2019-09-04 ENCOUNTER — Other Ambulatory Visit: Payer: Self-pay

## 2019-09-04 DIAGNOSIS — Z5321 Procedure and treatment not carried out due to patient leaving prior to being seen by health care provider: Secondary | ICD-10-CM | POA: Insufficient documentation

## 2019-09-04 DIAGNOSIS — M7918 Myalgia, other site: Secondary | ICD-10-CM | POA: Insufficient documentation
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
## 2019-09-05 ENCOUNTER — Ambulatory Visit (INDEPENDENT_AMBULATORY_CARE_PROVIDER_SITE_OTHER): Payer: Medicaid Other

## 2019-09-05 ENCOUNTER — Encounter (HOSPITAL_COMMUNITY): Payer: Self-pay

## 2019-09-05 ENCOUNTER — Other Ambulatory Visit: Payer: Self-pay

## 2019-09-05 ENCOUNTER — Ambulatory Visit (HOSPITAL_COMMUNITY)
Admission: EM | Admit: 2019-09-05 | Discharge: 2019-09-05 | Disposition: A | Discharge status: Home / Self Care | Payer: Medicaid Other | Attending: Emergency Medicine | Admitting: Emergency Medicine

## 2019-09-05 DIAGNOSIS — S40012A Contusion of left shoulder, initial encounter: Secondary | ICD-10-CM | POA: Diagnosis not present

## 2019-09-05 DIAGNOSIS — S7002XA Contusion of left hip, initial encounter: Secondary | ICD-10-CM | POA: Diagnosis not present

## 2019-09-05 MED ORDER — IBUPROFEN 600 MG PO TABS: 600.0000 mg | ORAL_TABLET | Freq: Four times a day (QID) | ORAL | 0 refills | Status: AC | PRN

## 2019-09-05 MED ORDER — TIZANIDINE HCL 4 MG PO TABS: 4.0000 mg | ORAL_TABLET | Freq: Three times a day (TID) | ORAL | 0 refills | Status: AC | PRN
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
## 2019-09-05 NOTE — Discharge Instructions (Addendum)
Your x-rays were negative for fracture or any acute changes.  600 mg ibuprofen combined with 1 g of Tylenol 3-4 times a day as needed for pain.  Zanaflex will help with the muscle spasms.  Some people find deep tissue massage helpful.  Heat or ice, whatever feels better.
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
## 2020-02-01 ENCOUNTER — Encounter (HOSPITAL_COMMUNITY): Payer: Self-pay | Admitting: Emergency Medicine

## 2020-02-01 ENCOUNTER — Emergency Department (HOSPITAL_COMMUNITY)
Admission: EM | Admit: 2020-02-01 | Discharge: 2020-02-01 | Discharge status: Against Medical Advice | Payer: Medicaid Other | Attending: Emergency Medicine | Admitting: Emergency Medicine

## 2020-02-01 DIAGNOSIS — Z5321 Procedure and treatment not carried out due to patient leaving prior to being seen by health care provider: Secondary | ICD-10-CM | POA: Insufficient documentation

## 2020-02-01 DIAGNOSIS — M7918 Myalgia, other site: Secondary | ICD-10-CM | POA: Insufficient documentation

## 2020-02-01 LAB — I-STAT BETA HCG BLOOD, ED (MC, WL, AP ONLY): I-stat hCG, quantitative: <5 m[IU]/mL (ref <5)

## 2020-02-01 LAB — BASIC METABOLIC PANEL
Anion gap: 10 (ref 5–15)
BUN: 11 mg/dL (ref 6–20)
CO2: 20 mmol/L — ABNORMAL LOW (ref 22–32)
Calcium: 9.2 mg/dL (ref 8.9–10.3)
Chloride: 106 mmol/L (ref 98–111)
Creatinine, Ser: 0.73 mg/dL (ref 0.44–1.00)
GFR calc Af Amer: >60 mL/min (ref >60)
GFR calc non Af Amer: >60 mL/min (ref >60)
Glucose, Bld: 113 mg/dL — ABNORMAL HIGH (ref 70–99)
Potassium: 3.6 mmol/L (ref 3.5–5.1)
Sodium: 136 mmol/L (ref 135–145)

## 2020-02-01 LAB — CBC
HCT: 38.1 % (ref 36.0–46.0)
Hemoglobin: 11.7 g/dL — ABNORMAL LOW (ref 12.0–15.0)
MCH: 26.3 pg (ref 26.0–34.0)
MCHC: 30.7 g/dL (ref 30.0–36.0)
MCV: 85.6 fL (ref 80.0–100.0)
Platelets: 229 10*3/uL (ref 150–400)
RBC: 4.45 MIL/uL (ref 3.87–5.11)
RDW: 15.5 % (ref 11.5–15.5)
WBC: 8.2 10*3/uL (ref 4.0–10.5)
nRBC: 0 % (ref 0.0–0.2)

## 2020-02-01 LAB — TROPONIN I (HIGH SENSITIVITY): Troponin I (High Sensitivity): 3 ng/L (ref <18)

## 2020-02-01 MED ORDER — SODIUM CHLORIDE 0.9% FLUSH: 3.0000 mL | Freq: Once | INTRAVENOUS | Status: DC

## 2020-02-01 NOTE — ED Notes (Signed)
Called pt outside and in waiting room, no response.

## 2020-02-01 NOTE — ED Triage Notes (Signed)
Pt reports while at work today she began to have body aches , loss of taste and a feeling of lightheadedness. Pt denies any fever or sob or cp.

## 2020-05-14 ENCOUNTER — Emergency Department (HOSPITAL_BASED_OUTPATIENT_CLINIC_OR_DEPARTMENT_OTHER)
Admission: EM | Admit: 2020-05-14 | Discharge: 2020-05-14 | Disposition: A | Discharge status: Home / Self Care | Payer: Medicaid Other | Attending: Emergency Medicine | Admitting: Emergency Medicine

## 2020-05-14 ENCOUNTER — Encounter (HOSPITAL_BASED_OUTPATIENT_CLINIC_OR_DEPARTMENT_OTHER): Payer: Self-pay | Admitting: Emergency Medicine

## 2020-05-14 ENCOUNTER — Other Ambulatory Visit: Payer: Self-pay

## 2020-05-14 ENCOUNTER — Emergency Department (HOSPITAL_BASED_OUTPATIENT_CLINIC_OR_DEPARTMENT_OTHER): Payer: Medicaid Other

## 2020-05-14 DIAGNOSIS — Y999 Unspecified external cause status: Secondary | ICD-10-CM | POA: Insufficient documentation

## 2020-05-14 DIAGNOSIS — J45909 Unspecified asthma, uncomplicated: Secondary | ICD-10-CM | POA: Insufficient documentation

## 2020-05-14 DIAGNOSIS — M25532 Pain in left wrist: Secondary | ICD-10-CM

## 2020-05-14 DIAGNOSIS — Y939 Activity, unspecified: Secondary | ICD-10-CM | POA: Diagnosis not present

## 2020-05-14 DIAGNOSIS — W19XXXA Unspecified fall, initial encounter: Secondary | ICD-10-CM | POA: Diagnosis not present

## 2020-05-14 DIAGNOSIS — Y929 Unspecified place or not applicable: Secondary | ICD-10-CM | POA: Diagnosis not present

## 2020-05-14 DIAGNOSIS — F17210 Nicotine dependence, cigarettes, uncomplicated: Secondary | ICD-10-CM | POA: Insufficient documentation

## 2020-05-14 MED ORDER — KETOROLAC TROMETHAMINE 15 MG/ML IJ SOLN
15.0000 mg | Freq: Once | INTRAMUSCULAR | Status: AC
  Administered 2020-05-14: 15 mg via INTRAMUSCULAR
  Filled 2020-05-14: qty 1

## 2020-05-14 NOTE — Discharge Instructions (Addendum)
You can take 600 mg of ibuprofen every 6 hours, you can take 1000 mg of Tylenol every 6 hours, you can alternate these every 3 or you can take them together.  Wear my splint is much as possible, it can be removed for hygiene purposes but wear it especially when sleeping and out and about.  If you want to use a sling you can however do not sleep in a sling

## 2020-05-14 NOTE — ED Provider Notes (Signed)
[facility] EMERGENCY DEPARTMENT Provider Note   CSN: [id number] Arrival date & time: 05/14/20  2979     History Chief Complaint  Patient presents with  . Fall    Shelby Leach is a 39 y.o. female.   Fall This is a new problem. The current episode started 1 to 2 hours ago. The problem occurs constantly. The problem has not changed since onset.Pertinent negatives include no chest pain, no headaches and no shortness of breath. Associated symptoms comments: Left wrist pain . Exacerbated by: wrist movement. Relieved by: rest. She has tried nothing for the symptoms. The treatment provided mild relief.       Past Medical History:  Diagnosis Date  . Asthma     There are no problems to display for this patient.   Past Surgical History:  Procedure Laterality Date  . CHOLECYSTECTOMY    . TUBAL LIGATION       OB History   No obstetric history on file.     Family History  Problem Relation Age of Onset  . Healthy Mother   . Healthy Father     Social History   Tobacco Use  . Smoking status: Current Every Day Smoker    Packs/day: 1.00    Types: Cigarettes  . Smokeless tobacco: Never Used  Vaping Use  . Vaping Use: Never used  Substance Use Topics  . Alcohol use: Yes    Comment: occassionally  . Drug use: Not Currently    Types: Marijuana    Home Medications Prior to Admission medications   Medication Sig Start Date End Date Taking? Authorizing Provider  ferrous sulfate 325 (65 FE) MG EC tablet Take 325 mg by mouth daily with breakfast.    [provider]  ibuprofen (ADVIL) 600 MG tablet Take 1 tablet (600 mg total) by mouth every 6 (six) hours as needed. 09/05/19   Domenick Gong, MD  lisinopril (PRINIVIL,ZESTRIL) 10 MG tablet TK 1 T PO QD 07/30/18   [provider]  LORazepam (ATIVAN) 1 MG tablet Take 1 tablet (1 mg total) by mouth 3 (three) times daily as needed for anxiety. 05/07/16   Dione Booze, MD  losartan (COZAAR) 25 MG  tablet Take 25 mg by mouth daily.    [provider]  metoprolol succinate (TOPROL-XL) 50 MG 24 hr tablet TK 1 T PO QD 07/30/18   [provider]  oxyCODONE (ROXICODONE) 15 MG immediate release tablet Take 15 mg by mouth 4 (four) times daily as needed for pain.  02/07/15   [provider]  tiZANidine (ZANAFLEX) 4 MG tablet Take 1 tablet (4 mg total) by mouth every 8 (eight) hours as needed for muscle spasms. 09/05/19   Domenick Gong, MD    Allergies    Dilaudid [hydromorphone hcl], Penicillins, and Hydrocodone  Review of Systems   Review of Systems  Constitutional: Negative for chills and fever.  HENT: Negative for congestion and rhinorrhea.   Respiratory: Negative for cough and shortness of breath.   Cardiovascular: Negative for chest pain and palpitations.  Gastrointestinal: Negative for diarrhea, nausea and vomiting.  Genitourinary: Negative for difficulty urinating and dysuria.  Musculoskeletal: Positive for arthralgias. Negative for back pain.  Skin: Negative for rash and wound.  Neurological: Negative for light-headedness and headaches.    Physical Exam Updated Vital Signs BP (!) 171/90 (BP Location: Right Arm)   Pulse 71   Temp 99.1 F (37.3 C) (Oral)   Resp 18   Ht 5\' 2"  (1.575 m)  Wt 108.9 kg   LMP 05/11/2020   SpO2 99%   BMI 43.90 kg/m   Physical Exam Vitals and nursing note reviewed. Exam conducted with a chaperone present.  Constitutional:      General: She is not in acute distress.    Appearance: Normal appearance.  HENT:     Head: Normocephalic and atraumatic.     Nose: No rhinorrhea.  Eyes:     General:        Right eye: No discharge.        Left eye: No discharge.     Conjunctiva/sclera: Conjunctivae normal.  Cardiovascular:     Rate and Rhythm: Normal rate and regular rhythm.  Pulmonary:     Effort: Pulmonary effort is normal. No respiratory distress.     Breath sounds: No stridor.  Abdominal:     General: Abdomen  is flat. There is no distension.     Palpations: Abdomen is soft.  Musculoskeletal:        General: Tenderness present. No signs of injury.     Comments: Tenderness to the entire wrist, to include the snuffbox and ulnar radial sides of the wrist.  Decreased range of motion due to pain however neurovascularly intact no focal bony tenderness of the forearm otherwise  Skin:    General: Skin is warm and dry.  Neurological:     General: No focal deficit present.     Mental Status: She is alert. Mental status is at baseline.     Motor: No weakness.  Psychiatric:        Mood and Affect: Mood normal.        Behavior: Behavior normal.     ED Results / Procedures / Treatments   Labs (all labs ordered are listed, but only abnormal results are displayed) Labs Reviewed - No data to display  EKG None  Radiology DG Forearm Left  Result Date: 05/14/2020 CLINICAL DATA:  Fall this morning with left forearm pain. EXAM: LEFT FOREARM - 2 VIEW COMPARISON:  None. FINDINGS: There is no evidence of fracture or other focal bone lesions. Soft tissues are unremarkable. IMPRESSION: Negative. Electronically Signed   By: Elberta Fortis M.D.   On: 05/14/2020 08:55   DG Wrist Complete Left  Result Date: 05/14/2020 CLINICAL DATA:  Fall this morning with left wrist pain. EXAM: LEFT WRIST - COMPLETE 3+ VIEW COMPARISON:  None. FINDINGS: There is no evidence of fracture or dislocation. There is no evidence of arthropathy or other focal bone abnormality. Soft tissues are unremarkable. IMPRESSION: Negative. Electronically Signed   By: Elberta Fortis M.D.   On: 05/14/2020 08:53    Procedures Procedures (including critical care time)  Medications Ordered in ED Medications  ketorolac (TORADOL) 15 MG/ML injection 15 mg (has no administration in time range)    ED Course  I have reviewed the triage vital signs and the nursing notes.  Pertinent labs & imaging results that were available during my care of the patient  were reviewed by me and considered in my medical decision making (see chart for details).    MDM Rules/Calculators/A&P                          Fall wrist pain, x-rays reviewed by radiology myself are negative.  Will get a thumb spica removable splint to follow-up in 1 week.  No other injury found reported.  Toradol given.  Over-the-counter pain medication recommended.  Strict return precautions provided Final  Clinical Impression(s) / ED Diagnoses Final diagnoses:  Acute pain of left wrist  Fall, initial encounter    Rx / DC Orders ED Discharge Orders    None       Sabino Donovan, MD 05/14/20 [phone]

## 2020-05-14 NOTE — ED Triage Notes (Signed)
Tripped and fell over a box. C/o L forearm and wrist pain
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
## 2022-01-06 IMAGING — CR DG FOREARM 2V*L*
2 series · 2 of 2 positions shown · non-contrast
Comparison: None.

CLINICAL DATA: Fall this morning with left forearm pain.

EXAM:
LEFT FOREARM - 2 VIEW

[x forearm lat left]
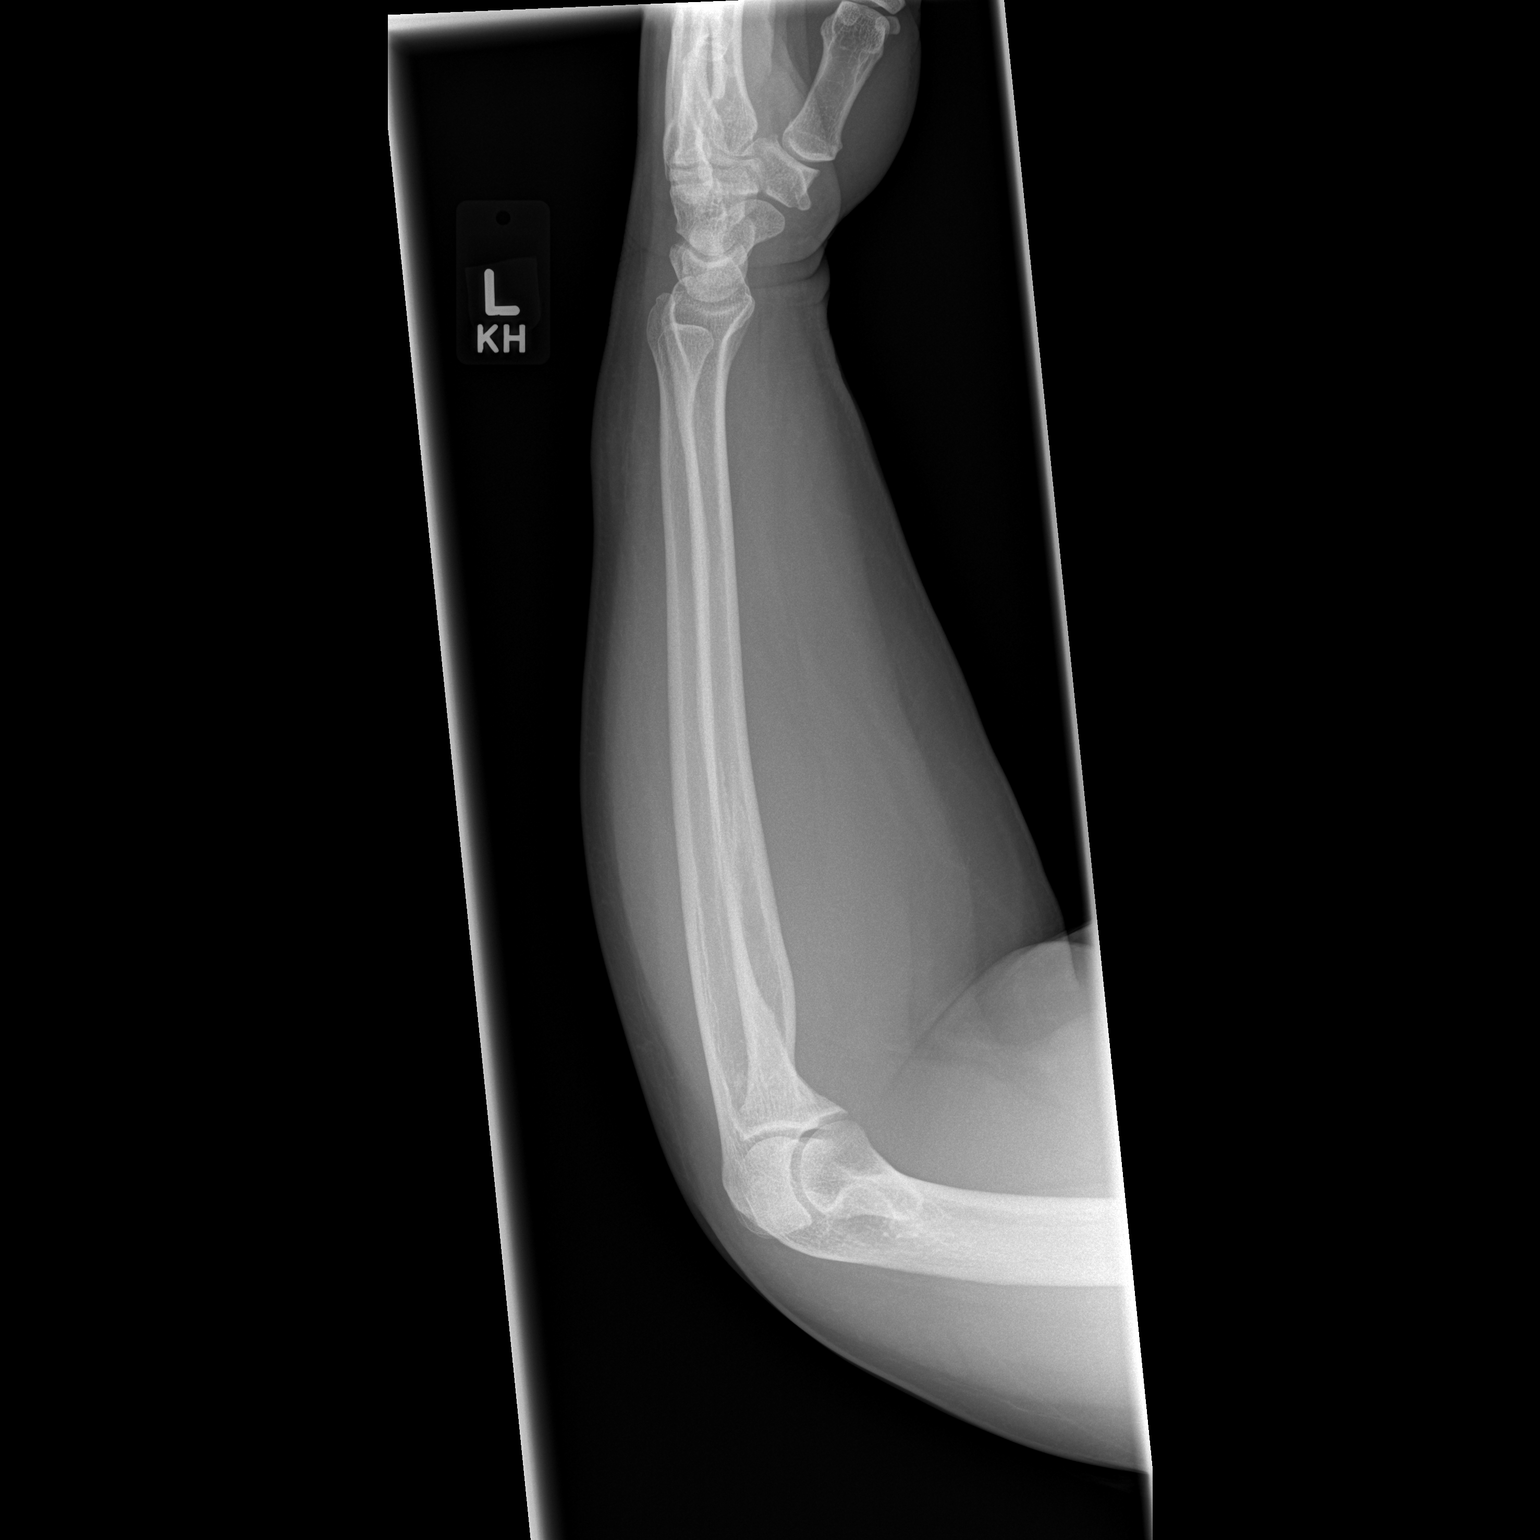

[x forearm ap left]
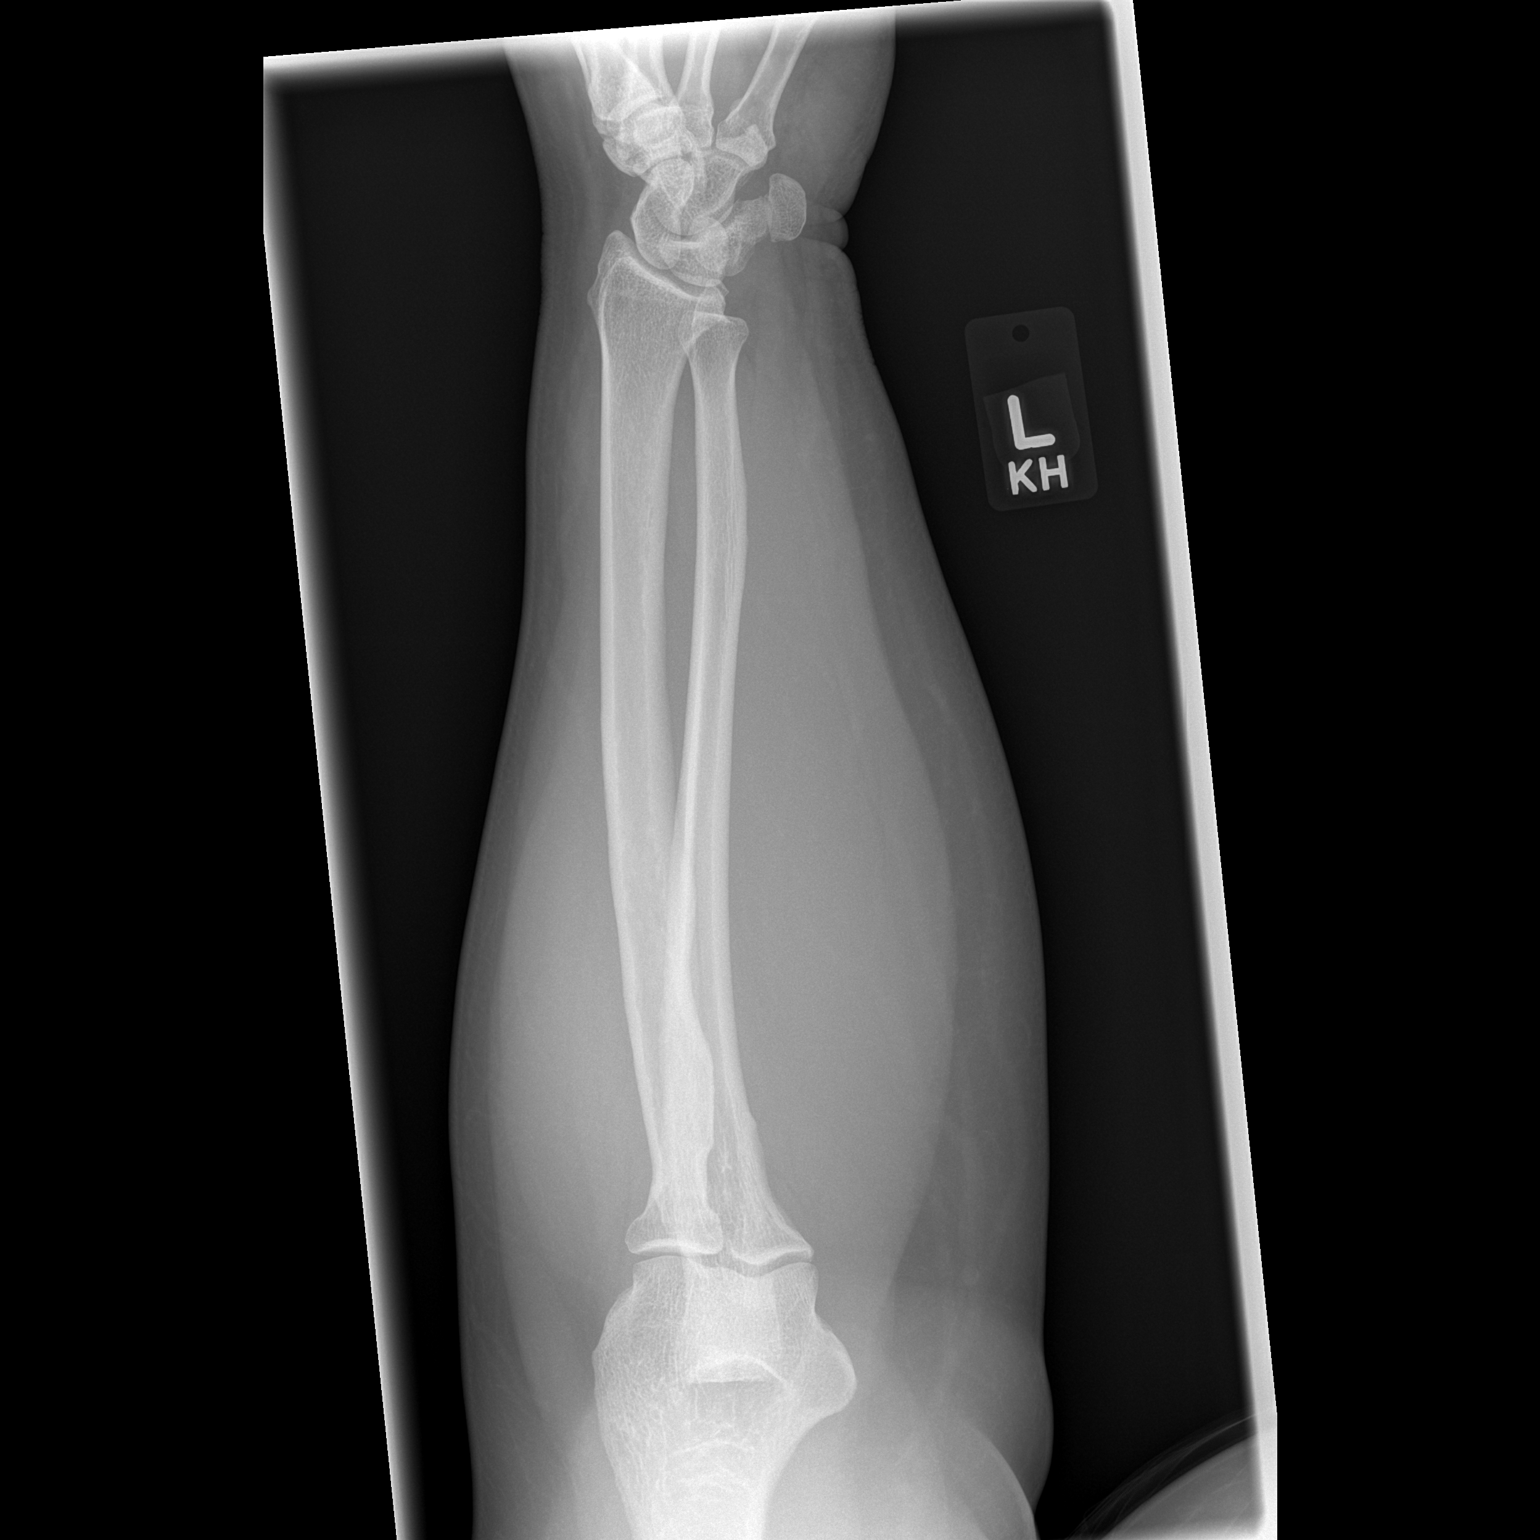

[2 of 2 positions shown; findings below may reference images not displayed]

FINDINGS: There is no evidence of fracture or other focal bone lesions. Soft
tissues are unremarkable.
IMPRESSION: Negative.

## 2022-02-27 ENCOUNTER — Encounter (HOSPITAL_COMMUNITY): Payer: Self-pay

## 2022-02-27 ENCOUNTER — Ambulatory Visit (HOSPITAL_COMMUNITY)
Admission: EM | Admit: 2022-02-27 | Discharge: 2022-02-27 | Disposition: A | Discharge status: Home / Self Care | Payer: Medicaid Other | Attending: Family Medicine | Admitting: Family Medicine

## 2022-02-27 DIAGNOSIS — N611 Abscess of the breast and nipple: Secondary | ICD-10-CM

## 2022-02-27 MED ORDER — DOXYCYCLINE HYCLATE 100 MG PO CAPS: 100.0000 mg | ORAL_CAPSULE | Freq: Two times a day (BID) | ORAL | 0 refills | Status: AC

## 2022-02-27 NOTE — ED Triage Notes (Signed)
C/o abscess under left arm pit. Pt reports this as ongoing problem.

## 2022-02-27 NOTE — ED Provider Notes (Signed)
[facility]   [id number] 02/27/22 Arrival Time: 0947  ASSESSMENT & PLAN:  1. Breast abscess    Incision and Drainage Procedure Note  Anesthesia: 2% plain lidocaine  Procedure Details  The procedure, risks and complications have been discussed in detail (including, but not limited to pain and bleeding) with the patient. Chaperone present for the entire procedure.  The skin induration under LEFT breast was prepped and draped in the usual fashion. After adequate local anesthesia, I&D with a #11 blade was performed on the left inferior breast with copious, purulent drainage.  EBL: minimal Drains: none Packing: iodoform Condition: Tolerated procedure well Complications: none.  Begin: Meds ordered this encounter  Medications   doxycycline (VIBRAMYCIN) 100 MG capsule    Sig: Take 1 capsule (100 mg total) by mouth 2 (two) times daily.    Dispense:  14 capsule    Refill:  0   Has oxycodone for pain.   Follow-up Information     [facility] In 2 days.   Specialty: Urgent Care Why: For wound check and packing removal. Return sooner if needed. Contact information: [address] [place] [address] [phone]               Reviewed expectations re: course of current medical issues. Questions answered. Outlined signs and symptoms indicating need for more acute intervention. Patient verbalized understanding. After Visit Summary given.   SUBJECTIVE:  Shelby Leach is a 41 y.o. female who presents with a possible infection of her LEFT breast. Onset gradual, approximately  this past week.  PCP placed on antibiotic; worsening, esp pain. No drainage or bleeding. Fever: absent.   OBJECTIVE:  Vitals:   02/27/22 1100  BP: 136/74  Pulse: 71  Resp: 16  Temp: 99 F (37.2 C)  TempSrc: Oral  SpO2: 99%     General appearance: alert; no distress L breast: approx 3 x 5 cm induration of her inferior LEFT  breast; very tender to touch; no active drainage or bleeding Psychological: alert and cooperative; normal mood and affect  Allergies  Allergen Reactions   Dilaudid [Hydromorphone Hcl] Shortness Of Breath   Penicillins Hives   Hydrocodone Rash    Past Medical History:  Diagnosis Date   Asthma    Social History   Socioeconomic History   Marital status: Divorced    Spouse name: Not on file   Number of children: Not on file   Years of education: Not on file   Highest education level: Not on file  Occupational History   Not on file  Tobacco Use   Smoking status: Every Day    Packs/day: 1.00    Types: Cigarettes   Smokeless tobacco: Never  Vaping Use   Vaping Use: Never used  Substance and Sexual Activity   Alcohol use: Yes    Comment: occassionally   Drug use: Not Currently    Types: Marijuana   Sexual activity: Yes  Other Topics Concern   Not on file  Social History Narrative   Not on file   Social Determinants of Health   Financial Resource Strain: Not on file  Food Insecurity: Not on file  Transportation Needs: Not on file  Physical Activity: Not on file  Stress: Not on file  Social Connections: Not on file   Family History  Problem Relation Age of Onset   Healthy Mother    Healthy Father    Past Surgical History:  Procedure Laterality Date  CHOLECYSTECTOMY     TUBAL LIGATION              Mardella Layman, MD 02/27/22 1454

## 2022-02-27 NOTE — Discharge Instructions (Signed)
Continue to take your oxycodone for pain.

## 2022-03-07 ENCOUNTER — Telehealth (HOSPITAL_COMMUNITY): Payer: Self-pay | Admitting: Emergency Medicine

## 2022-03-07 NOTE — Telephone Encounter (Signed)
Spoke with patient regarding her visit on 5/31. Pt states that she did not know if she needed to return to have the packing removed. Pt states that she has now notice yellow discharge coming from the area. Pt advised to come back in so a provider can take a look at the site and remove the packing

## 2022-03-08 ENCOUNTER — Ambulatory Visit (HOSPITAL_COMMUNITY)
Admission: EM | Admit: 2022-03-08 | Discharge: 2022-03-08 | Disposition: A | Discharge status: Home / Self Care | Payer: Medicaid Other | Attending: Internal Medicine | Admitting: Internal Medicine

## 2022-03-08 ENCOUNTER — Encounter (HOSPITAL_COMMUNITY): Payer: Self-pay | Admitting: Emergency Medicine

## 2022-03-08 DIAGNOSIS — Z5189 Encounter for other specified aftercare: Secondary | ICD-10-CM | POA: Diagnosis not present

## 2022-03-08 NOTE — Discharge Instructions (Addendum)
Continue daily wound dressing changes It is okay to wash with soap and water Complete the course of antibiotics Return to urgent care if you have any other concerns.

## 2022-03-08 NOTE — ED Triage Notes (Signed)
Pt reports an abscess under left breast. States she had the abscess drained 1 week ago and needs the packing removed,

## 2022-03-10 NOTE — ED Provider Notes (Signed)
[facility]    CSN: [id number] Arrival date & time: 03/08/22  1844      History   Chief Complaint Chief Complaint  Patient presents with   Follow-up    HPI Shelby Leach is a 41 y.o. female comes to the urgent care for follow-up.  She was seen on 5/31 for breast abscess which was surgically drained and packed.  He was sent home on a course of antibiotics.  She comes to the urgent care for recheck.  Packing is still in place.  Purulent drainage is decreased.  Erythema and induration has improved as well.  She endorses improvement in level of pain.  No fever or chills.   HPI  Past Medical History:  Diagnosis Date   Asthma     There are no problems to display for this patient.   Past Surgical History:  Procedure Laterality Date   CHOLECYSTECTOMY     TUBAL LIGATION      OB History   No obstetric history on file.      Home Medications    Prior to Admission medications   Medication Sig Start Date End Date Taking? Authorizing Provider  doxycycline (VIBRAMYCIN) 100 MG capsule Take 1 capsule (100 mg total) by mouth 2 (two) times daily. 02/27/22   Mardella Layman, MD  ferrous sulfate 325 (65 FE) MG EC tablet Take 325 mg by mouth daily with breakfast.    [provider]  ibuprofen (ADVIL) 600 MG tablet Take 1 tablet (600 mg total) by mouth every 6 (six) hours as needed. 09/05/19   Domenick Gong, MD  lisinopril (PRINIVIL,ZESTRIL) 10 MG tablet TK 1 T PO QD 07/30/18   [provider]  LORazepam (ATIVAN) 1 MG tablet Take 1 tablet (1 mg total) by mouth 3 (three) times daily as needed for anxiety. 05/07/16   Dione Booze, MD  losartan (COZAAR) 25 MG tablet Take 25 mg by mouth daily.    [provider]  metoprolol succinate (TOPROL-XL) 50 MG 24 hr tablet TK 1 T PO QD 07/30/18   [provider]  oxyCODONE (ROXICODONE) 15 MG immediate release tablet Take 15 mg by mouth 4 (four) times daily as needed for pain.  02/07/15   [provider]  tiZANidine (ZANAFLEX) 4 MG tablet Take 1 tablet (4 mg total) by mouth every 8 (eight) hours as needed for muscle spasms. 09/05/19   Domenick Gong, MD    Family History Family History  Problem Relation Age of Onset   Healthy Mother    Healthy Father     Social History Social History   Tobacco Use   Smoking status: Every Day    Packs/day: 1.00    Types: Cigarettes   Smokeless tobacco: Never  Vaping Use   Vaping Use: Never used  Substance Use Topics   Alcohol use: Yes    Comment: occassionally   Drug use: Not Currently    Types: Marijuana     Allergies   Dilaudid [hydromorphone hcl], Penicillins, and Hydrocodone   Review of Systems Review of Systems  Musculoskeletal: Negative.   Skin:  Positive for wound. Negative for color change, pallor and rash.     Physical Exam Triage Vital Signs ED Triage Vitals  Enc Vitals Group     BP 03/08/22 1914 (!) 150/95     Pulse Rate 03/08/22 1914 77     Resp 03/08/22 1914 17     Temp 03/08/22 1914 98.9 F (37.2 C)     Temp  Source 03/08/22 1914 Oral     SpO2 03/08/22 1914 97 %     Weight 03/08/22 1913 240 lb 1.3 oz (108.9 kg)     Height 03/08/22 1913 5\' 2"  (1.575 m)     Head Circumference --      Peak Flow --      Pain Score 03/08/22 1913 0     Pain Loc --      Pain Edu? --      Excl. in GC? --    No data found.  Updated Vital Signs BP (!) 150/95 (BP Location: Right Arm)   Pulse 77   Temp 98.9 F (37.2 C) (Oral)   Resp 17   Ht 5\' 2"  (1.575 m)   Wt 108.9 kg   SpO2 97%   BMI 43.91 kg/m   Visual Acuity Right Eye Distance:   Left Eye Distance:   Bilateral Distance:    Right Eye Near:   Left Eye Near:    Bilateral Near:     Physical Exam Vitals and nursing note reviewed. Exam conducted with a chaperone present.  Constitutional:      General: She is not in acute distress.    Appearance: She is not ill-appearing.  Cardiovascular:     Rate and Rhythm: Normal rate and regular rhythm.   Skin:    Comments: Wound on the inferior surface of the lest breast.  Packing is in place. No significant duration or erythema noted.  Neurological:     Mental Status: She is alert.      UC Treatments / Results  Labs (all labs ordered are listed, but only abnormal results are displayed) Labs Reviewed - No data to display  EKG   Radiology No results found.  Procedures Procedures (including critical care time)  Medications Ordered in UC Medications - No data to display  Initial Impression / Assessment and Plan / UC Course  I have reviewed the triage vital signs and the nursing notes.  Pertinent labs & imaging results that were available during my care of the patient were reviewed by me and considered in my medical decision making (see chart for details).     Wound check: Packing was removed.  The base of the wound is clean with healthy granulation tissue Patient is advised to complete course of antibiotics Tylenol/Motrin as needed for pain Continue with daily wound dressing changes Return precautions given. Final Clinical Impressions(s) / UC Diagnoses   Final diagnoses:  Wound check, abscess     Discharge Instructions      Continue daily wound dressing changes It is okay to wash with soap and water Complete the course of antibiotics Return to urgent care if you have any other concerns.   ED Prescriptions   None    PDMP not reviewed this encounter.   05/08/22, MD 03/10/22 1152

## 2023-04-10 ENCOUNTER — Emergency Department (HOSPITAL_BASED_OUTPATIENT_CLINIC_OR_DEPARTMENT_OTHER)
Admission: EM | Admit: 2023-04-10 | Discharge: 2023-04-10 | Disposition: A | Discharge status: Home / Self Care | Payer: Medicaid Other | Attending: Emergency Medicine | Admitting: Emergency Medicine

## 2023-04-10 ENCOUNTER — Encounter (HOSPITAL_BASED_OUTPATIENT_CLINIC_OR_DEPARTMENT_OTHER): Payer: Self-pay

## 2023-04-10 ENCOUNTER — Other Ambulatory Visit: Payer: Self-pay

## 2023-04-10 DIAGNOSIS — N939 Abnormal uterine and vaginal bleeding, unspecified: Secondary | ICD-10-CM | POA: Insufficient documentation

## 2023-04-10 LAB — CBC WITH DIFFERENTIAL/PLATELET
Abs Immature Granulocytes: 0.03 10*3/uL (ref 0.00–0.07)
Basophils Absolute: 0.1 10*3/uL (ref 0.0–0.1)
Basophils Relative: 1 %
Eosinophils Absolute: 0.3 10*3/uL (ref 0.0–0.5)
Eosinophils Relative: 3 %
HCT: 30.1 % — ABNORMAL LOW (ref 36.0–46.0)
Hemoglobin: 9.1 g/dL — ABNORMAL LOW (ref 12.0–15.0)
Immature Granulocytes: 0 %
Lymphocytes Relative: 43 %
Lymphs Abs: 3.9 10*3/uL (ref 0.7–4.0)
MCH: 23.3 pg — ABNORMAL LOW (ref 26.0–34.0)
MCHC: 30.2 g/dL (ref 30.0–36.0)
MCV: 77.2 fL — ABNORMAL LOW (ref 80.0–100.0)
Monocytes Absolute: 0.7 10*3/uL (ref 0.1–1.0)
Monocytes Relative: 7 %
Neutro Abs: 4.1 10*3/uL (ref 1.7–7.7)
Neutrophils Relative %: 46 %
Platelets: 258 10*3/uL (ref 150–400)
RBC: 3.9 MIL/uL (ref 3.87–5.11)
RDW: 17.3 % — ABNORMAL HIGH (ref 11.5–15.5)
WBC: 9 10*3/uL (ref 4.0–10.5)
nRBC: 0 % (ref 0.0–0.2)

## 2023-04-10 LAB — PREGNANCY, URINE: Preg Test, Ur: NEGATIVE

## 2023-04-10 LAB — BASIC METABOLIC PANEL
Anion gap: 8 (ref 5–15)
BUN: 8 mg/dL (ref 6–20)
CO2: 25 mmol/L (ref 22–32)
Calcium: 9 mg/dL (ref 8.9–10.3)
Chloride: 104 mmol/L (ref 98–111)
Creatinine, Ser: 0.75 mg/dL (ref 0.44–1.00)
GFR, Estimated: >60 mL/min (ref >60)
Glucose, Bld: 101 mg/dL — ABNORMAL HIGH (ref 70–99)
Potassium: 3.3 mmol/L — ABNORMAL LOW (ref 3.5–5.1)
Sodium: 137 mmol/L (ref 135–145)

## 2023-04-10 MED ORDER — MEDROXYPROGESTERONE ACETATE 10 MG PO TABS
10.0000 mg | ORAL_TABLET | Freq: Once | ORAL | Status: AC
  Administered 2023-04-10: 10 mg via ORAL
  Filled 2023-04-10: qty 1

## 2023-04-10 MED ORDER — MEDROXYPROGESTERONE ACETATE 10 MG PO TABS: 10.0000 mg | ORAL_TABLET | Freq: Every day | ORAL | 0 refills | Status: AC

## 2023-04-10 NOTE — ED Triage Notes (Addendum)
Pt started menstrual cycle July 1st and is still bleeding. Cycle usually last on 4 to 5 days. Pt is using 3-4 pads an hour and tonight she is feeling dizzy nauseated. Also having sharp lower abd pain.

## 2023-04-10 NOTE — ED Provider Notes (Signed)
[place] [facility]  Provider Note  CSN: [id number] Arrival date & time: 04/10/23 6578  History Chief Complaint  Patient presents with   Vaginal Bleeding   Abdominal Pain    Shelby Leach is a 42 y.o. female with no significant PMH reports about 10 days of heavy vaginal bleeding, changing a pad 3-4 times per hour at times. Passing some clots. She usually has periods lasting only 4-5 days so this is unusual for her. Tonight she was feeling lightheaded and dizzy and was having some sharp pain in L pelvis area. No fevers. Some nausea but no vomiting. She does not have a regular Ob/Gyn.    Home Medications Prior to Admission medications   Medication Sig Start Date End Date Taking? Authorizing Provider  medroxyPROGESTERone (PROVERA) 10 MG tablet Take 1 tablet (10 mg total) by mouth daily for 10 days. 04/10/23 04/20/23 Yes Pollyann Savoy, MD  doxycycline (VIBRAMYCIN) 100 MG capsule Take 1 capsule (100 mg total) by mouth 2 (two) times daily. 02/27/22   Mardella Layman, MD  ferrous sulfate 325 (65 FE) MG EC tablet Take 325 mg by mouth daily with breakfast.    [provider]  ibuprofen (ADVIL) 600 MG tablet Take 1 tablet (600 mg total) by mouth every 6 (six) hours as needed. 09/05/19   Domenick Gong, MD  lisinopril (PRINIVIL,ZESTRIL) 10 MG tablet TK 1 T PO QD 07/30/18   [provider]  LORazepam (ATIVAN) 1 MG tablet Take 1 tablet (1 mg total) by mouth 3 (three) times daily as needed for anxiety. 05/07/16   Dione Booze, MD  losartan (COZAAR) 25 MG tablet Take 25 mg by mouth daily.    [provider]  metoprolol succinate (TOPROL-XL) 50 MG 24 hr tablet TK 1 T PO QD 07/30/18   [provider]  oxyCODONE (ROXICODONE) 15 MG immediate release tablet Take 15 mg by mouth 4 (four) times daily as needed for pain.  02/07/15   [provider]  tiZANidine (ZANAFLEX) 4 MG tablet Take 1 tablet (4 mg total) by mouth every 8  (eight) hours as needed for muscle spasms. 09/05/19   Domenick Gong, MD     Allergies    Dilaudid [hydromorphone hcl], Penicillins, and Hydrocodone   Review of Systems   Review of Systems Please see HPI for pertinent positives and negatives  Physical Exam BP 122/82   Pulse 73   Temp 98.7 F (37.1 C) (Oral)   Ht 5\' 3"  (1.6 m)   Wt 121.1 kg   LMP 03/31/2023   SpO2 100%   BMI 47.30 kg/m   Physical Exam Vitals and nursing note reviewed.  Constitutional:      Appearance: Normal appearance.  HENT:     Head: Normocephalic and atraumatic.     Nose: Nose normal.     Mouth/Throat:     Mouth: Mucous membranes are moist.  Eyes:     Extraocular Movements: Extraocular movements intact.     Conjunctiva/sclera: Conjunctivae normal.  Cardiovascular:     Rate and Rhythm: Normal rate.  Pulmonary:     Effort: Pulmonary effort is normal.     Breath sounds: Normal breath sounds.  Abdominal:     General: Abdomen is flat.     Palpations: Abdomen is soft.     Tenderness: There is abdominal tenderness in the left lower quadrant. There is no guarding. Negative signs include Murphy's sign and McBurney's sign.  Musculoskeletal:  General: No swelling. Normal range of motion.     Cervical back: Neck supple.  Skin:    General: Skin is warm and dry.  Neurological:     General: No focal deficit present.     Mental Status: She is alert.  Psychiatric:        Mood and Affect: Mood normal.     ED Results / Procedures / Treatments   EKG None  Procedures Procedures  Medications Ordered in the ED Medications  medroxyPROGESTERone (PROVERA) tablet 10 mg (10 mg Oral Given 04/10/23 0355)    Initial Impression and Plan  Patient here with menorrhagia, has had tubes tied, does not think she is pregnant. Will check HCG and labs to determine degree of blood loss. She is currently hemodynamically stable.   ED Course   Clinical Course as of 04/10/23 0618  Thu Apr 10, 2023  0315 HCG  is negative.  Hgb is low, but similar to previous and not in need of transfusion.  [CS]  0328 BMP is unremarkable.  [CS]  [id number] Discussed results with patient. Will begin a course of Provera for her bleeding. Recommend outpatient Ob/Gyn follow up for recheck and consideration of [place] which is not available here at this time. No emergent indication for [place] at this time. Recommend she return to ED, preferably at [facility] if bleeding is not improving.  [CS]    Clinical Course User Index [CS] Pollyann Savoy, MD     MDM Rules/Calculators/A&P Medical Decision Making Given presenting complaint, I considered that admission might be necessary. After review of results from ED lab and/or imaging studies, admission to the hospital is not indicated at this time.    Problems Addressed: Abnormal vaginal bleeding: acute illness or injury  Amount and/or Complexity of Data Reviewed Labs: ordered. Decision-making details documented in ED Course.  Risk Prescription drug management.     Final Clinical Impression(s) / ED Diagnoses Final diagnoses:  Abnormal vaginal bleeding    Rx / DC Orders ED Discharge Orders          Ordered    medroxyPROGESTERone (PROVERA) 10 MG tablet  Daily        04/10/23 0350             Pollyann Savoy, MD 04/10/23 [phone]

## 2023-07-10 ENCOUNTER — Emergency Department (HOSPITAL_BASED_OUTPATIENT_CLINIC_OR_DEPARTMENT_OTHER): Payer: Medicaid Other

## 2023-07-10 ENCOUNTER — Encounter (HOSPITAL_BASED_OUTPATIENT_CLINIC_OR_DEPARTMENT_OTHER): Payer: Self-pay

## 2023-07-10 ENCOUNTER — Other Ambulatory Visit: Payer: Self-pay

## 2023-07-10 ENCOUNTER — Emergency Department (HOSPITAL_BASED_OUTPATIENT_CLINIC_OR_DEPARTMENT_OTHER)
Admission: EM | Admit: 2023-07-10 | Discharge: 2023-07-10 | Disposition: A | Discharge status: Home / Self Care | Payer: Medicaid Other | Attending: Emergency Medicine | Admitting: Emergency Medicine

## 2023-07-10 DIAGNOSIS — I10 Essential (primary) hypertension: Secondary | ICD-10-CM | POA: Insufficient documentation

## 2023-07-10 DIAGNOSIS — Z79899 Other long term (current) drug therapy: Secondary | ICD-10-CM | POA: Diagnosis not present

## 2023-07-10 DIAGNOSIS — R0789 Other chest pain: Secondary | ICD-10-CM | POA: Insufficient documentation

## 2023-07-10 LAB — I-STAT CHEM 8, ED
BUN: 14 mg/dL (ref 6–20)
Calcium, Ion: 1.11 mmol/L — ABNORMAL LOW (ref 1.15–1.40)
Chloride: 105 mmol/L (ref 98–111)
Creatinine, Ser: 0.8 mg/dL (ref 0.44–1.00)
Glucose, Bld: 119 mg/dL — ABNORMAL HIGH (ref 70–99)
HCT: 34 % — ABNORMAL LOW (ref 36.0–46.0)
Hemoglobin: 11.6 g/dL — ABNORMAL LOW (ref 12.0–15.0)
Potassium: 4 mmol/L (ref 3.5–5.1)
Sodium: 138 mmol/L (ref 135–145)
TCO2: 25 mmol/L (ref 22–32)

## 2023-07-10 LAB — CBC
HCT: 32.7 % — ABNORMAL LOW (ref 36.0–46.0)
Hemoglobin: 9.8 g/dL — ABNORMAL LOW (ref 12.0–15.0)
MCH: 22.6 pg — ABNORMAL LOW (ref 26.0–34.0)
MCHC: 30 g/dL (ref 30.0–36.0)
MCV: 75.3 fL — ABNORMAL LOW (ref 80.0–100.0)
Platelets: 252 10*3/uL (ref 150–400)
RBC: 4.34 MIL/uL (ref 3.87–5.11)
RDW: 19.1 % — ABNORMAL HIGH (ref 11.5–15.5)
WBC: 11.4 10*3/uL — ABNORMAL HIGH (ref 4.0–10.5)
nRBC: 0 % (ref 0.0–0.2)

## 2023-07-10 LAB — TROPONIN I (HIGH SENSITIVITY): Troponin I (High Sensitivity): 3 ng/L (ref <18)

## 2023-07-10 MED ORDER — IOHEXOL 350 MG/ML SOLN
100.0000 mL | Freq: Once | INTRAVENOUS | Status: AC | PRN
  Administered 2023-07-10: 100 mL via INTRAVENOUS

## 2023-07-10 MED ORDER — NAPROXEN 500 MG PO TABS: 500.0000 mg | ORAL_TABLET | Freq: Two times a day (BID) | ORAL | 0 refills | Status: AC

## 2023-07-10 MED ORDER — KETOROLAC TROMETHAMINE 30 MG/ML IJ SOLN
30.0000 mg | Freq: Once | INTRAMUSCULAR | Status: AC
  Administered 2023-07-10: 30 mg via INTRAVENOUS
  Filled 2023-07-10: qty 1

## 2023-07-10 MED ORDER — MORPHINE SULFATE (PF) 4 MG/ML IV SOLN
4.0000 mg | Freq: Once | INTRAVENOUS | Status: AC
  Administered 2023-07-10: 4 mg via INTRAVENOUS
  Filled 2023-07-10: qty 1

## 2023-07-10 NOTE — ED Provider Notes (Signed)
[place] [facility] Provider Note   CSN: [id number] Arrival date & time: 07/10/23  4098     History  Chief Complaint  Patient presents with   Chest Pain    Shelby Leach is a 42 y.o. female.  Patient is a 41 year old female with history of hypertension, chronic hip pain.  Patient presenting today with complaints of chest discomfort.  She reports a 5-day history of pain to the left upper chest that has been constant and sharp in nature.  This makes her left arm feel numb.  She denies any shortness of breath, nausea, or diaphoresis.  No injury or trauma.  Patient was seen 5 days ago at [facility] and workup was unremarkable.  She was discharged with a steroid taper, however her pain persists.  The history is provided by the patient.       Home Medications Prior to Admission medications   Medication Sig Start Date End Date Taking? Authorizing Provider  doxycycline (VIBRAMYCIN) 100 MG capsule Take 1 capsule (100 mg total) by mouth 2 (two) times daily. 02/27/22   Mardella Layman, MD  ferrous sulfate 325 (65 FE) MG EC tablet Take 325 mg by mouth daily with breakfast.    [provider]  ibuprofen (ADVIL) 600 MG tablet Take 1 tablet (600 mg total) by mouth every 6 (six) hours as needed. 09/05/19   Domenick Gong, MD  lisinopril (PRINIVIL,ZESTRIL) 10 MG tablet TK 1 T PO QD 07/30/18   [provider]  LORazepam (ATIVAN) 1 MG tablet Take 1 tablet (1 mg total) by mouth 3 (three) times daily as needed for anxiety. 05/07/16   Dione Booze, MD  losartan (COZAAR) 25 MG tablet Take 25 mg by mouth daily.    [provider]  medroxyPROGESTERone (PROVERA) 10 MG tablet Take 1 tablet (10 mg total) by mouth daily for 10 days. 04/10/23 04/20/23  Pollyann Savoy, MD  metoprolol succinate (TOPROL-XL) 50 MG 24 hr tablet TK 1 T PO QD 07/30/18   [provider]  oxyCODONE (ROXICODONE) 15 MG immediate release tablet Take 15  mg by mouth 4 (four) times daily as needed for pain.  02/07/15   [provider]  tiZANidine (ZANAFLEX) 4 MG tablet Take 1 tablet (4 mg total) by mouth every 8 (eight) hours as needed for muscle spasms. 09/05/19   Domenick Gong, MD      Allergies    Dilaudid [hydromorphone hcl], Penicillins, and Hydrocodone    Review of Systems   Review of Systems  All other systems reviewed and are negative.   Physical Exam Updated Vital Signs BP 112/82   Pulse 74   Temp 97.8 F (36.6 C) (Oral)   Resp 20   Ht 5\' 3"  (1.6 m)   Wt 121.1 kg   SpO2 99%   BMI 47.30 kg/m  Physical Exam Vitals and nursing note reviewed.  Constitutional:      General: She is not in acute distress.    Appearance: She is well-developed. She is not diaphoretic.  HENT:     Head: Normocephalic and atraumatic.  Cardiovascular:     Rate and Rhythm: Normal rate and regular rhythm.     Heart sounds: No murmur heard.    No friction rub. No gallop.  Pulmonary:     Effort: Pulmonary effort is normal. No respiratory distress.     Breath sounds: Normal breath sounds. No wheezing.  Abdominal:     General: Bowel sounds are normal.  There is no distension.     Palpations: Abdomen is soft.     Tenderness: There is no abdominal tenderness.  Musculoskeletal:        General: Normal range of motion.     Cervical back: Normal range of motion and neck supple.  Skin:    General: Skin is warm and dry.  Neurological:     General: No focal deficit present.     Mental Status: She is alert and oriented to person, place, and time.     ED Results / Procedures / Treatments   Labs (all labs ordered are listed, but only abnormal results are displayed) Labs Reviewed  CBC - Abnormal; Notable for the following components:      Result Value   WBC 11.4 (*)    Hemoglobin 9.8 (*)    HCT 32.7 (*)    MCV 75.3 (*)    MCH 22.6 (*)    RDW 19.1 (*)    All other components within normal limits  I-STAT CHEM 8, ED - Abnormal;  Notable for the following components:   Glucose, Bld 119 (*)    Calcium, Ion 1.11 (*)    Hemoglobin 11.6 (*)    HCT 34.0 (*)    All other components within normal limits  PREGNANCY, URINE  TROPONIN I (HIGH SENSITIVITY)    EKG EKG Interpretation Date/Time:  Thursday July 10 2023 02:43:02 EDT Ventricular Rate:  69 PR Interval:  149 QRS Duration:  91 QT Interval:  413 QTC Calculation: 443 R Axis:   55  Text Interpretation: Sinus rhythm Borderline T wave abnormalities Unchanged from 02/01/2020 Confirmed by Geoffery Lyons ([id number]) on 07/10/2023 2:46:16 AM  Radiology No results found.  Procedures Procedures    Medications Ordered in ED Medications  ketorolac (TORADOL) 30 MG/ML injection 30 mg (has no administration in time range)  morphine (PF) 4 MG/ML injection 4 mg (has no administration in time range)    ED Course/ Medical Decision Making/ A&P  Patient is a 42 year old female presenting with complaints of chest pain as described in the HPI.  Patient arrives here with stable vital signs and physical examination which is unremarkable.  Workup initiated including CBC, metabolic panel, and troponin, all of which are unremarkable.  CTA of the chest obtained showing no evidence for pulmonary embolism, aortic dissection, or other emergent intrathoracic process.  Patient was given Toradol and morphine for her pain and is now resting comfortably.  I feel as though she can safely be discharged with what I believed to be musculoskeletal pain/costochondritis.  Patient to be discharged with NSAIDs, rest, and follow-up as needed.  Final Clinical Impression(s) / ED Diagnoses Final diagnoses:  None    Rx / DC Orders ED Discharge Orders     None         Geoffery Lyons, MD 07/10/23 [id number]

## 2023-07-10 NOTE — Discharge Instructions (Addendum)
Begin taking naproxen as prescribed.  Rest.  Follow-up with primary doctor if symptoms are not improving in the next few days.

## 2023-07-10 NOTE — ED Triage Notes (Signed)
Pt reports she was seen about 5 days ago for CP and given a steroid taper. Pt states that after taking the steroids she has been having worsening LT sided CP that radiates into LT arm. Pt endorses generalized weakness. Pt denies any other symptoms. Pt denies dizziness, no slurred speech, no numbness, no N/V/D. No  headache.
